# Patient Record
Sex: Male | Born: 1945 | Race: White | Hispanic: No | Marital: Married | Smoking: Never smoker
Health system: Southern US, Community
[De-identification: ages and names within clinical notes are randomized; demographics above are authoritative.]

## PROBLEM LIST (undated history)

## (undated) DIAGNOSIS — I10 Essential (primary) hypertension: Secondary | ICD-10-CM

## (undated) DIAGNOSIS — N529 Male erectile dysfunction, unspecified: Secondary | ICD-10-CM

## (undated) DIAGNOSIS — G4733 Obstructive sleep apnea (adult) (pediatric): Secondary | ICD-10-CM

## (undated) DIAGNOSIS — U071 COVID-19: Secondary | ICD-10-CM

## (undated) DIAGNOSIS — M489 Spondylopathy, unspecified: Secondary | ICD-10-CM

## (undated) DIAGNOSIS — E7800 Pure hypercholesterolemia, unspecified: Secondary | ICD-10-CM

## (undated) DIAGNOSIS — E291 Testicular hypofunction: Secondary | ICD-10-CM

## (undated) DIAGNOSIS — E669 Obesity, unspecified: Secondary | ICD-10-CM

## (undated) HISTORY — DX: Pure hypercholesterolemia, unspecified: E78.00

## (undated) HISTORY — DX: Obesity, unspecified: E66.9

## (undated) HISTORY — DX: Obstructive sleep apnea (adult) (pediatric): G47.33

## (undated) HISTORY — DX: Testicular hypofunction: E29.1

## (undated) HISTORY — PX: KNEE SURGERY: SHX244

## (undated) HISTORY — DX: Essential (primary) hypertension: I10

## (undated) HISTORY — DX: COVID-19: U07.1

## (undated) HISTORY — DX: Spondylopathy, unspecified: M48.9

## (undated) HISTORY — DX: Male erectile dysfunction, unspecified: N52.9

---

## 2009-07-22 ENCOUNTER — Encounter: Payer: Self-pay | Admitting: Cardiology

## 2009-09-02 DIAGNOSIS — E663 Overweight: Secondary | ICD-10-CM | POA: Insufficient documentation

## 2009-09-02 DIAGNOSIS — I10 Essential (primary) hypertension: Secondary | ICD-10-CM | POA: Insufficient documentation

## 2009-09-03 ENCOUNTER — Ambulatory Visit: Payer: Self-pay | Admitting: Cardiology

## 2009-09-03 DIAGNOSIS — E785 Hyperlipidemia, unspecified: Secondary | ICD-10-CM

## 2009-09-03 DIAGNOSIS — R9431 Abnormal electrocardiogram [ECG] [EKG]: Secondary | ICD-10-CM

## 2009-09-30 ENCOUNTER — Encounter: Payer: Self-pay | Admitting: Cardiology

## 2009-09-30 ENCOUNTER — Ambulatory Visit: Payer: Self-pay

## 2009-09-30 ENCOUNTER — Ambulatory Visit: Payer: Self-pay | Admitting: Cardiovascular Disease

## 2009-09-30 ENCOUNTER — Ambulatory Visit (HOSPITAL_COMMUNITY): Admission: RE | Admit: 2009-09-30 | Discharge: 2009-09-30 | Payer: Self-pay | Admitting: Cardiology

## 2010-05-01 HISTORY — PX: ROTATOR CUFF REPAIR: SHX139

## 2010-06-02 NOTE — Letter (Signed)
Summary: [facility]   Imported By: Kassie Mends 09/16/2009 08:57:19  _____________________________________________________________________  External Attachment:    Type:   Image     Comment:   External Document

## 2010-06-02 NOTE — Assessment & Plan Note (Signed)
Summary: np6/ self referral / over weight, htn, b/p up, cardiac workup...   Primary Provider:  [place]  CC:  Pt wasnt to get establish with a cardiologist.  History of Present Illness: 65 yo male with no prior cardiac history for evaluation of risk factors and abnormal electrocardiogram. He has no prior cardiac history. There is no dyspnea on exertion, orthopnea, PND, pedal edema, palpitations, syncope, chest pain or claudication.  Preventive Screening-Counseling & Management  Alcohol-Tobacco     Smoking Status: never  Current Medications (verified): 1)  Lipitor 10 Mg Tabs (Atorvastatin Calcium) .... Take One Tablet By Mouth Daily. 2)  Aspirin 81 Mg Tbec (Aspirin) .... Take One Tablet By Mouth Daily 3)  Benazepril Hcl 20 Mg Tabs (Benazepril Hcl) .Marland Kitchen.. 1 Tab By Mouth Once Daily 4)  Advil 200 Mg Tabs (Ibuprofen) .... As Needed  Past History:  Past Medical History: HYPERTENSION (ICD-401.9) Hyperlipidemia  Past Surgical History: Knee surgery  Family History: Reviewed history and no changes required. No premature CAD  Social History: Reviewed history and no changes required. Married  Tobacco Use - No.  Alcohol Use - yes Full Time Smoking Status:  never  Review of Systems       Some problems with knee arthralgias but no fevers or chills, productive cough, hemoptysis, dysphasia, odynophagia, melena, hematochezia, dysuria, hematuria, rash, seizure activity, orthopnea, PND, pedal edema, claudication. Remaining systems are negative.   Vital Signs:  Patient profile:   65 year old male Height:      73 inches Weight:      242 pounds BMI:     32.04 Pulse rate:   61 / minute Resp:     14 per minute BP sitting:   157 / 84  (left arm)  Vitals Entered By: Kem Parkinson (Sep 03, 2009 8:29 AM)  Physical Exam  General:  Well developed/well nourished in NAD Skin warm/dry Patient not depressed No peripheral clubbing Back-normal HEENT-normal/normal eyelids Neck  supple/normal carotid upstroke bilaterally; no bruits; no JVD; no thyromegaly chest - CTA/ normal expansion CV - RRR/normal S1 and S2; no murmurs, rubs or gallops;  PMI nondisplaced Abdomen -NT/ND, no HSM, no mass, + bowel sounds, no bruit 2+ femoral pulses, no bruits Ext-no edema, chords, 2+ DP Neuro-grossly nonfocal     EKG  Procedure date:  09/03/2009  Findings:      Sinus rhythm at a rate of 61. RV conduction delay. Cannot rule out prior inferior infarct.  Impression & Recommendations:  Problem # 1:  ABNORMAL ELECTROCARDIOGRAM (ICD-794.31)  No cardiac symptoms. Electrocardiogram shows sinus rhythm and cannot rule out prior inferior infarct. I will schedule a stress echocardiogram for risk stratification. The following medications were removed from the medication list:    Mavik 1 Mg Tabs (Trandolapril) .Marland Kitchen... 1 tab by mouth once daily His updated medication list for this problem includes:    Aspirin 81 Mg Tbec (Aspirin) .Marland Kitchen... Take one tablet by mouth daily    Benazepril Hcl 20 Mg Tabs (Benazepril hcl) .Marland Kitchen... 1 tab by mouth once daily  Orders: Stress Echo (Stress Echo)  Problem # 2:  HYPERTENSION (ICD-401.9) Patient's blood pressure is elevated today. I've asked him to follow this at home. If his systolic is greater than 140 or diastolic greater than 85 then we will increase his lotensin or add low-dose diuretic. The following medications were removed from the medication list:    Mavik 1 Mg Tabs (Trandolapril) .Marland Kitchen... 1 tab by mouth once daily His updated medication list for this  problem includes:    Aspirin 81 Mg Tbec (Aspirin) .Marland Kitchen... Take one tablet by mouth daily    Benazepril Hcl 20 Mg Tabs (Benazepril hcl) .Marland Kitchen... 1 tab by mouth once daily  The following medications were removed from the medication list:    Mavik 1 Mg Tabs (Trandolapril) .Marland Kitchen... 1 tab by mouth once daily His updated medication list for this problem includes:    Aspirin 81 Mg Tbec (Aspirin) .Marland Kitchen... Take one  tablet by mouth daily    Benazepril Hcl 20 Mg Tabs (Benazepril hcl) .Marland Kitchen... 1 tab by mouth once daily  Problem # 3:  HYPERLIPIDEMIA (ICD-272.4) Continue statin. Lipids and liver monitor by primary care. Previous HDL is low. We discussed the importance of exercise. His updated medication list for this problem includes:    Lipitor 10 Mg Tabs (Atorvastatin calcium) .Marland Kitchen... Take one tablet by mouth daily.  His updated medication list for this problem includes:    Lipitor 10 Mg Tabs (Atorvastatin calcium) .Marland Kitchen... Take one tablet by mouth daily.  Orders: Stress Echo (Stress Echo)  Problem # 4:  OVERWEIGHT (ICD-278.02) Would discuss the importance of exercise and diet today.  Patient Instructions: 1)  Your physician recommends that you schedule a follow-up appointment as needed 2)  Your physician has requested that you have a stress echocardiogram. For further information please visit [email or web address]  Please follow instruction sheet as given.

## 2012-10-18 ENCOUNTER — Institutional Professional Consult (permissible substitution): Payer: Self-pay | Admitting: Neurology

## 2013-01-16 ENCOUNTER — Encounter: Payer: Self-pay | Admitting: Neurology

## 2013-01-16 ENCOUNTER — Ambulatory Visit (INDEPENDENT_AMBULATORY_CARE_PROVIDER_SITE_OTHER): Payer: 59 | Admitting: Neurology

## 2013-01-16 VITALS — BP 135/81 | HR 63 | Temp 98.6°F | Ht 74.0 in | Wt 244.0 lb

## 2013-01-16 DIAGNOSIS — E785 Hyperlipidemia, unspecified: Secondary | ICD-10-CM | POA: Diagnosis not present

## 2013-01-16 DIAGNOSIS — G4761 Periodic limb movement disorder: Secondary | ICD-10-CM

## 2013-01-16 DIAGNOSIS — I10 Essential (primary) hypertension: Secondary | ICD-10-CM | POA: Diagnosis not present

## 2013-01-16 DIAGNOSIS — G4733 Obstructive sleep apnea (adult) (pediatric): Secondary | ICD-10-CM

## 2013-01-16 NOTE — Progress Notes (Signed)
Subjective:    Patient ID: Raymond Marshall is a 67 y.o. male.  HPI  Huston Foley, MD, PhD [facility] [address] [address] [place], [place] [address]  Dear Dr. Wylene Simmer,  I saw your patient, Raymond Marshall, upon your kind request in my neurologic clinic today for initial consultation of his sleep disorder, in particular concern for obstructive sleep apnea. The patient is accompanied by his wife today. As you know, Raymond Marshall is a very pleasant 67 year old right-handed gentleman with an underlying medical history of obesity, hyperlipidemia, cervical spine disease, who has been experiencing daytime somnolence and has been known to snore, which can be loud. He has made snorting and gasping sounds at night. He kicks quite vehemently at times in his sleep.  His bedtime is between 8:30 - 9 PM and has occasional difficulty falling asleep. He has a wake time of 5:30-6 AM and does not feel rested in the morning. He denies morning HAs. He has been sleepy during the day for years and his ESS is 15/24 today. He has a FHx of OSA in his father (suspected, not tested), PU (on CPAP) and his brother (who had surgery for OSA).  He has tried temazepam and takes it infrequently, perhaps once every 1-2 months. He had a HST on 01/06/13, reported on 01/10/13, with an est. AHI of 23/h and min. O2 saturation of 87.7%.  He denies morning headaches. He has not fallen asleep while driving. The patient has not been taking a scheduled nap, but sometimes when he comes home for lunch he falls asleep after lunch.   He has been known to snore for the past many years. Snoring is reportedly moderate, and associated with choking sounds. The patient denies a sense of choking or strangling feeling. There is no report of nighttime reflux, with no nighttime cough experienced. The patient has not noted any RLS symptoms, but kicks in his sleep, almost every 20 s, per wife. He denies cataplexy, sleep paralysis,  hypnagogic or hypnopompic hallucinations, or sleep attacks. He does not report any vivid dreams, nightmares, dream enactments, or parasomnias, such as sleep talking or sleep walking.   His bedroom is usually dark and cool. There is a TV in the bedroom and usually it is not on at night.   His Past Medical History Is Significant For: Past Medical History  Diagnosis Date  . Hypertension   . High cholesterol     His Past Surgical History Is Significant For: Past Surgical History  Procedure Laterality Date  . Rotator cuff repair  2012  . Knee surgery Left     His Family History Is Significant For: Family History  Problem Relation Age of Onset  . Cancer Maternal Grandmother     His Social History Is Significant For: History   Social History  . Marital Status: Married    Spouse Name: Raymond Marshall    Number of Children: 2  . Years of Education: Assoc. Deg   Occupational History  .      [employer]   Social History Main Topics  . Smoking status: Current Some Day Smoker    Types: Cigars  . Smokeless tobacco: Never Used     Comment: 2 or 3 times monthly  . Alcohol Use: Yes     Comment: 4-5 alcohol drinks per week  . Drug Use: No  . Sexual Activity: None   Other Topics Concern  . None   Social History Narrative   Patient lives  at home with spouse.   Caffeine Use: occasionally    His Allergies Are:  No Known Allergies:   His Current Medications Are:  Outpatient Encounter Prescriptions as of 01/16/2013  Medication Sig Dispense Refill  . atorvastatin (LIPITOR) 10 MG tablet Take 10 mg by mouth daily.      . benazepril (LOTENSIN) 20 MG tablet Take 20 mg by mouth daily.       No facility-administered encounter medications on file as of 01/16/2013.  :  Review of Systems:  Out of a complete 14 point review of systems, all are reviewed and negative with the exception of these symptoms as listed below:  Review of Systems  Constitutional: Positive for fatigue.  HENT:        Ringing in ears  Eyes:       Blurred vision  Musculoskeletal: Positive for arthralgias.  Neurological: Positive for numbness.  Psychiatric/Behavioral: Positive for sleep disturbance (insomnia, snoring, restless legs).       Decreased energy    Objective:  Neurologic Exam  Physical Exam Physical Examination:   Filed Vitals:   01/16/13 0902  BP: 135/81  Pulse: 63  Temp: 98.6 F (37 C)    General Examination: The patient is a very pleasant 67 y.o. male in no acute distress. He appears well-developed and well-nourished and very well groomed. He is overweight.   HEENT: Normocephalic, atraumatic, pupils are equal, round and reactive to light and accommodation. Funduscopic exam is normal with sharp disc margins noted. Extraocular tracking is good without limitation to gaze excursion or nystagmus noted. Normal smooth pursuit is noted. Hearing is grossly intact. Tympanic membranes are clear bilaterally. Face is symmetric with normal facial animation and normal facial sensation. Speech is clear with no dysarthria noted. There is no hypophonia. There is no lip, neck/head, jaw or voice tremor. Neck is supple with full range of passive and active motion. There are no carotid bruits on auscultation. Oropharynx exam reveals: mild mouth dryness, adequate dental hygiene and moderate airway crowding, due to redundant soft palate, elongated uvula, and larger tongue. I could not visualize his tonsils. Mallampati is class III. Tongue protrudes centrally and palate elevates symmetrically. Neck size is 17.75 inches.   Chest: Clear to auscultation without wheezing, rhonchi or crackles noted.  Heart: S1+S2+0, regular and normal without murmurs, rubs or gallops noted.   Abdomen: Soft, non-tender and non-distended with normal bowel sounds appreciated on auscultation.  Extremities: There is trace pitting edema in the distal lower extremities bilaterally. Pedal pulses are intact.  Skin: Warm and dry without  trophic changes noted. There are no varicose veins.  Musculoskeletal: exam reveals no obvious joint deformities, tenderness or joint swelling or erythema.   Neurologically:  Mental status: The patient is awake, alert and oriented in all 4 spheres. His memory, attention, language and knowledge are appropriate. There is no aphasia, agnosia, apraxia or anomia. Speech is clear with normal prosody and enunciation. Thought process is linear. Mood is congruent and affect is normal.  Cranial nerves are as described above under HEENT exam. In addition, shoulder shrug is normal with equal shoulder height noted. Motor exam: Normal bulk, strength and tone is noted. There is no drift, tremor or rebound. Romberg is negative. Reflexes are 2+ throughout. Toes are downgoing bilaterally. Fine motor skills are intact with normal finger taps, normal hand movements, normal rapid alternating patting, normal foot taps and normal foot agility.  Cerebellar testing shows no dysmetria or intention tremor on finger to nose testing. Heel to  shin is unremarkable bilaterally. There is no truncal or gait ataxia.  Sensory exam is intact to light touch, pinprick, vibration, temperature sense and proprioception in the upper and lower extremities.  Gait, station and balance are unremarkable. No veering to one side is noted. No leaning to one side is noted. Posture is age-appropriate and stance is narrow based. No problems turning are noted. He turns en bloc. Tandem walk is unremarkable. Intact toe and heel stance is noted.              Assessment and Plan:   In summary, DAMACIO WEISGERBER is a very pleasant 67 y.o.-year old male with a history and physical exam concerning for obstructive sleep apnea (OSA). A recent HST demonstrated an estimated AHI of 23 per hour with an oxyhemoglobin desaturation nadir of 87.7%. 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 He also has a history significant for periodic leg movements of sleep, in the  absence of overt RLS symptoms. I had a long chat with the patient and his wife about my findings and the diagnosis of OSA, its prognosis and treatment options. We talked about medical treatments and non-pharmacological approaches. I explained in particular the risks and ramifications of untreated moderate to severe OSA, especially with respect to developing cardiovascular disease down the Road, including congestive heart failure, difficult to treat hypertension, cardiac arrhythmias, or stroke. Even type 2 diabetes has in part been linked to untreated OSA. We talked about trying to maintain a healthy lifestyle in general, as well as the importance of weight control. I encouraged the patient to eat healthy, exercise daily and keep well hydrated, to keep a scheduled bedtime and wake time routine, to not skip any meals and eat healthy snacks in between meals.  I recommended the following at this time: sleep study with potential positive airway pressure titration.  I explained the sleep test procedure to the patient and also outlined possible surgical and non-surgical treatment options of OSA, including the use of a custom-made dental device, upper airway surgical options, such as pillar implants, radiofrequency surgery, tongue base surgery, and UPPP. I also explained the CPAP treatment option to the patient, who indicated that he would be willing to try CPAP if the need arises. I explained the importance of being compliant with PAP treatment, not only for insurance purposes but primarily to improve His symptoms, and for the patient's long term health benefit, including to reduce His cardiovascular risks. I answered all their questions today and the patient and his wife were in agreement. I would like to see him back after the sleep study is completed and encouraged them to call with any interim questions, concerns, problems or updates.   Thank you very much for allowing me to participate in the care of this nice  patient. If I can be of any further assistance to you please do not hesitate to call me at [phone].  Sincerely,   Huston Foley, MD, PhD

## 2013-01-16 NOTE — Patient Instructions (Signed)
Based on your symptoms and your exam I believe you are at risk for obstructive sleep apnea or OSA, and I think we should proceed with a sleep study to determine whether you do or do not have OSA and how severe it is. If you have more than mild OSA, I want you to consider treatment with CPAP. Please remember, the risks and ramifications of moderate to severe obstructive sleep apnea or OSA are: Cardiovascular disease, including congestive heart failure, stroke, difficult to control hypertension, arrhythmias, and even type 2 diabetes has been linked to untreated OSA. Sleep apnea causes disruption of sleep and sleep deprivation in most cases, which, in turn, can cause recurrent headaches, problems with memory, mood, concentration, focus, and vigilance. Most people with untreated sleep apnea report excessive daytime sleepiness, which can affect their ability to drive. Please do not drive if you feel sleepy.  I will see you back after your sleep study to go over the test results and where to go from there. We will call you after your sleep study and to set up an appointment at the time.   Based on your recent home sleep test, you may have moderate OSA.

## 2013-01-30 DIAGNOSIS — J069 Acute upper respiratory infection, unspecified: Secondary | ICD-10-CM | POA: Diagnosis not present

## 2013-01-30 DIAGNOSIS — Z6837 Body mass index (BMI) 37.0-37.9, adult: Secondary | ICD-10-CM | POA: Diagnosis not present

## 2013-02-12 ENCOUNTER — Ambulatory Visit (INDEPENDENT_AMBULATORY_CARE_PROVIDER_SITE_OTHER): Payer: Medicare Other | Admitting: Neurology

## 2013-02-12 DIAGNOSIS — G479 Sleep disorder, unspecified: Secondary | ICD-10-CM

## 2013-02-12 DIAGNOSIS — G4733 Obstructive sleep apnea (adult) (pediatric): Secondary | ICD-10-CM | POA: Diagnosis not present

## 2013-02-12 DIAGNOSIS — I10 Essential (primary) hypertension: Secondary | ICD-10-CM

## 2013-02-12 DIAGNOSIS — G4710 Hypersomnia, unspecified: Secondary | ICD-10-CM

## 2013-02-12 DIAGNOSIS — G4761 Periodic limb movement disorder: Secondary | ICD-10-CM | POA: Diagnosis not present

## 2013-02-12 DIAGNOSIS — R9431 Abnormal electrocardiogram [ECG] [EKG]: Secondary | ICD-10-CM

## 2013-02-21 ENCOUNTER — Telehealth: Payer: Self-pay | Admitting: Neurology

## 2013-02-21 DIAGNOSIS — G4733 Obstructive sleep apnea (adult) (pediatric): Secondary | ICD-10-CM

## 2013-02-21 NOTE — Telephone Encounter (Signed)
Please call and notify the patient that the recent sleep study did confirm the diagnosis of obstructive sleep apnea and that I recommend treatment for this in the form of CPAP. This will require a repeat sleep study for proper titration and mask fitting. Please advise him, that we did not end up using CPAP during this last test, because of poorly consolidated sleep, and his sleep apnea became worse later, as he went into dream sleep. Due to persistent desaturations into the 80s, I believe, he will do better with CPAP. Please explain to patient and arrange for a CPAP titration study. I have placed an order in the chart. Thanks, Huston Foley, MD, PhD [facility])

## 2013-02-24 ENCOUNTER — Encounter: Payer: Self-pay | Admitting: *Deleted

## 2013-02-24 NOTE — Telephone Encounter (Signed)
I called and left a message for the patient to callback to speak with me Raymond Marshall) concerning his sleep study results.

## 2013-02-25 NOTE — Telephone Encounter (Signed)
Patient's spouse called for his sleep study results. Informed the spouse. I informed the patient's spouse that his recent sleep study revealed the diagnosis of obstructive sleep apnea and that Dr. Frances Furbish is recommending CPAP therapy, so the patient will need to be set-up for a another sleep study (CPAP Titration). Patient's spouse understood and has set a date and time for this study.

## 2013-03-03 DIAGNOSIS — N529 Male erectile dysfunction, unspecified: Secondary | ICD-10-CM | POA: Diagnosis not present

## 2013-03-03 DIAGNOSIS — G4733 Obstructive sleep apnea (adult) (pediatric): Secondary | ICD-10-CM | POA: Diagnosis not present

## 2013-03-03 DIAGNOSIS — E785 Hyperlipidemia, unspecified: Secondary | ICD-10-CM | POA: Diagnosis not present

## 2013-03-03 DIAGNOSIS — I10 Essential (primary) hypertension: Secondary | ICD-10-CM | POA: Diagnosis not present

## 2013-03-03 DIAGNOSIS — Z79899 Other long term (current) drug therapy: Secondary | ICD-10-CM | POA: Diagnosis not present

## 2013-03-03 DIAGNOSIS — Z1331 Encounter for screening for depression: Secondary | ICD-10-CM | POA: Diagnosis not present

## 2013-03-03 DIAGNOSIS — G4700 Insomnia, unspecified: Secondary | ICD-10-CM | POA: Diagnosis not present

## 2013-03-03 DIAGNOSIS — Z23 Encounter for immunization: Secondary | ICD-10-CM | POA: Diagnosis not present

## 2013-03-19 ENCOUNTER — Ambulatory Visit (INDEPENDENT_AMBULATORY_CARE_PROVIDER_SITE_OTHER): Payer: Medicare Other

## 2013-03-19 DIAGNOSIS — G479 Sleep disorder, unspecified: Secondary | ICD-10-CM | POA: Diagnosis not present

## 2013-03-19 DIAGNOSIS — G4710 Hypersomnia, unspecified: Secondary | ICD-10-CM | POA: Diagnosis not present

## 2013-03-19 DIAGNOSIS — G4761 Periodic limb movement disorder: Secondary | ICD-10-CM

## 2013-03-19 DIAGNOSIS — G4733 Obstructive sleep apnea (adult) (pediatric): Secondary | ICD-10-CM

## 2013-04-02 ENCOUNTER — Telehealth: Payer: Self-pay | Admitting: Neurology

## 2013-04-02 DIAGNOSIS — G4733 Obstructive sleep apnea (adult) (pediatric): Secondary | ICD-10-CM

## 2013-04-02 DIAGNOSIS — G4761 Periodic limb movement disorder: Secondary | ICD-10-CM

## 2013-04-02 NOTE — Telephone Encounter (Signed)
Please call and inform patient that I have entered an order for treatment with PAP. He did well during the latest sleep study with CPAP. We will, therefore, arrange for a machine for home use through a DME (durable medical equipment) company of His choice; and I will see the patient back in follow-up in about 6 weeks. Please also explain to the patient that I will be looking out for compliance data downloaded from the machine, which can be done remotely through a modem at times or stored on an SD card in the back of the machine. At the time of the followup appointment we will discuss sleep study results and how it is going with PAP treatment at home. Please advise patient to bring His machine at the time of the visit; at least for the first visit, even though this is cumbersome. Bringing the machine for every visit after that may not be needed, but often helps for the first visit. Please also make sure, the patient has a follow-up appointment with me in about 6 weeks from the setup date, thanks.   Dameshia Seybold, MD, PhD [facility]  

## 2013-04-03 ENCOUNTER — Encounter: Payer: Self-pay | Admitting: *Deleted

## 2013-04-03 NOTE — Telephone Encounter (Signed)
I called and spoke with patient's spouse about his sleep study results. I informed the patient's spouse that the patient did well on CPAP and that Dr. Frances Furbish recommends he start CPAP therapy at home.  I informed the patient's spouse that I will send the CPAP order to [facility] and will contact them. I will also mail a copy of the report along with a letter with follow instructions. I will fax a copy to Dr. Deneen Harts office.

## 2013-05-15 ENCOUNTER — Encounter: Payer: Self-pay | Admitting: Neurology

## 2013-05-20 NOTE — Progress Notes (Signed)
Quick Note:  I reviewed the patient's CPAP compliance data from 04/11/2013 to 05/10/2013, which is a total of 30 days, during which time the patient used CPAP every day. The average usage for all days was 7 hours and 45 minutes. The percent used days greater than 4 hours was 97 %, indicating excellent compliance. The residual AHI was 2.9 per hour, indicating an appropriate treatment pressure of 10 cwp with EPR of 2. However, his leak was at times quite high, with the 95th percentile at 34.3 L per minute. I will review this data with the patient at the next office visit, which has been scheduled for 05/22/2013 at 11 AM, provide feedback and additional troubleshooting if need be.  Star Age, MD, PhD [facility]   ______

## 2013-05-22 ENCOUNTER — Ambulatory Visit: Payer: Medicare Other | Admitting: Neurology

## 2013-05-28 ENCOUNTER — Ambulatory Visit (INDEPENDENT_AMBULATORY_CARE_PROVIDER_SITE_OTHER): Payer: Medicare Other | Admitting: Neurology

## 2013-05-28 ENCOUNTER — Encounter (INDEPENDENT_AMBULATORY_CARE_PROVIDER_SITE_OTHER): Payer: Self-pay

## 2013-05-28 ENCOUNTER — Encounter: Payer: Self-pay | Admitting: Neurology

## 2013-05-28 VITALS — BP 134/83 | HR 62 | Temp 97.8°F | Ht 74.0 in | Wt 252.0 lb

## 2013-05-28 DIAGNOSIS — G4761 Periodic limb movement disorder: Secondary | ICD-10-CM

## 2013-05-28 DIAGNOSIS — E785 Hyperlipidemia, unspecified: Secondary | ICD-10-CM | POA: Diagnosis not present

## 2013-05-28 DIAGNOSIS — E669 Obesity, unspecified: Secondary | ICD-10-CM

## 2013-05-28 DIAGNOSIS — I10 Essential (primary) hypertension: Secondary | ICD-10-CM | POA: Diagnosis not present

## 2013-05-28 DIAGNOSIS — G4733 Obstructive sleep apnea (adult) (pediatric): Secondary | ICD-10-CM

## 2013-05-28 NOTE — Patient Instructions (Signed)

## 2013-05-28 NOTE — Progress Notes (Signed)
Subjective:    Patient ID: Raymond Marshall is a 68 y.o. male.  HPI    Interim history:   Raymond Marshall is a very pleasant 68 year old right-handed gentleman with an underlying medical history of obesity, hyperlipidemia, and cervical spine disease, who presents for followup consultation of his obstructive sleep apnea. He is unaccompanied today. I first met him on 01/16/2013, at which time he reported loud snoring and excessive daytime somnolence as well as leg kicking while asleep. I suggested a sleep study and he came back for baseline sleep study on 02/12/2013, followed by a CPAP titration study on 03/19/2013. I went over his test results with him in detail today. His baseline sleep study on 02/12/2013 showed a sleep efficiency at 68.4% with a normal latency to sleep of 11.5 minutes and a high wake after sleep onset of 143 minutes with severe sleep fragmentation noted. He had an increased arousal index. This was because of respiratory events and periodic leg movements. He had increased percentages were stage I and stage II sleep, near absence of slow-wave sleep and a decreased percentage of REM sleep with a markedly prolonged REM latency of 322.5 minutes. He had severe periodic leg movements at 78.9 per hour and an associated arousal index of 9.9 per hour. He had occasional PVCs on EKG. He had mild to moderate snoring. His total AHI was 7.7 per hour, rising to 43 per hour and REM sleep and nearly 17 per hour in the supine position. Baseline oxygen saturation was only 91%, with a nadir of 84%. He spent 1 hour and 18 minutes below the saturation of 90% for the night. He was requested to return for CPAP titration study which he had on 03/19/2013. His sleep efficiency was near normal at 89.3% with a normal latency to sleep and wake after sleep onset of 43.5 minutes with mild sleep fragmentation noted. He had increased percentages of light stage sleep, he had near absence of slow-wave sleep, and a decreased  percentage of REM sleep with a mildly prolonged REM latency. He again was noted to have severe periodic leg movements at 72.8 per hour resulting in an arousal index of 7.3 per hour. He again was noted to have occasional PVCs. His average oxygen saturation was 92%, his nadir was 85% and he spent 9 minutes and 15 seconds below the saturation of 90%. He was titrated from 5-10 cm of CPAP and had a residual AHI of 0 per hour at the final pressure with supine REM sleep achieved. I reviewed compliance data from 04/11/2013 through 05/10/2013 which is a total of 30 days during which time he used CPAP every night, percent used days greater than 4 hours was 97%, indicating excellent compliance. His average usage was 7 hours and 45 minutes. Residual AHI was 2.9 per hour on the set pressure of 10 cm with EPR of 2. However, his leak was at times quite high with the 95th percentile of 34.3 L per minute.  Today, I reviewed the compliance data off his machine: 04/11/2013 through 05/27/2013 which is a total of 47 days during which time his CPAP every night. Percent used days greater than 4 hours was 96%, indicating excellent compliance. AHI was 2.8. His leak again was at times quite high. The 95th percentile was 35 L per minute. His pressures at 10 with EPR of 2. The average usage for all days was 7 hours and 43 minutes.  Today, he reports resting much better and sleeping better with CPAP, having  adjusted well to treatment. He does not have to take a nap in the afternoon. He does not have RLS symptoms, but his wife reports, that he does not kick in his sleep as much. He has no new complaints and feels, he is doing well. He is using a nasal pillows mask. He has had no recent illness or changes in his medications. He continues to work full time. He denies any palpitations or chest pain or any cardiac symptoms. He does not have any edema.  He had a HST on 01/06/13, reported on 01/10/13, with an est. AHI of 23/h and min. O2 saturation  of 87.7%.    His Past Medical History Is Significant For: Past Medical History  Diagnosis Date  . Hypertension   . High cholesterol     His Past Surgical History Is Significant For: Past Surgical History  Procedure Laterality Date  . Rotator cuff repair  2012  . Knee surgery Left     His Family History Is Significant For: Family History  Problem Relation Age of Onset  . Cancer Maternal Grandmother     His Social History Is Significant For: History   Social History  . Marital Status: Married    Spouse Name: Bonnita Nasuti    Number of Children: 2  . Years of Education: Assoc. Deg   Occupational History  .      [employer]   Social History Main Topics  . Smoking status: Current Some Day Smoker    Types: Cigars  . Smokeless tobacco: Never Used     Comment: 2 or 3 times monthly  . Alcohol Use: Yes     Comment: 4-5 alcohol drinks per week  . Drug Use: No  . Sexual Activity: None   Other Topics Concern  . None   Social History Narrative   Patient lives at home with spouse.   Caffeine Use: occasionally    His Allergies Are:  No Known Allergies:   His Current Medications Are:  Outpatient Encounter Prescriptions as of 05/28/2013  Medication Sig  . atorvastatin (LIPITOR) 10 MG tablet Take 10 mg by mouth daily.  . benazepril (LOTENSIN) 20 MG tablet Take 20 mg by mouth daily.  :  Review of Systems:  Out of a complete 14 point review of systems, all are reviewed and negative with the exception of these symptoms as listed below:  Review of Systems  Constitutional: Negative.   HENT: Positive for tinnitus.   Eyes: Negative.   Respiratory: Negative.   Cardiovascular: Negative.   Gastrointestinal: Negative.   Endocrine: Negative.   Genitourinary: Negative.   Skin: Negative.   Allergic/Immunologic: Negative.   Neurological: Negative.   Hematological: Negative.   Psychiatric/Behavioral: Positive for sleep disturbance (restless leg).    Objective:  Neurologic  Exam  Physical Exam Physical Examination:   Filed Vitals:   05/28/13 1114  BP: 134/83  Pulse: 62  Temp: 97.8 F (36.6 C)    General Examination: The patient is a very pleasant 68 y.o. male in no acute distress. He appears well-developed and well-nourished and very well groomed. He is overweight.   HEENT: Normocephalic, atraumatic, pupils are equal, round and reactive to light and accommodation. Funduscopic exam is normal with sharp disc margins noted. Extraocular tracking is good without limitation to gaze excursion or nystagmus noted. Normal smooth pursuit is noted. Hearing is grossly intact. Face is symmetric with normal facial animation and normal facial sensation. Speech is clear with no dysarthria noted. There is no hypophonia. There  is no lip, neck/head, jaw or voice tremor. Neck is supple with full range of passive and active motion. There are no carotid bruits on auscultation. Oropharynx exam reveals: mild mouth dryness, adequate dental hygiene and moderate airway crowding, due to redundant soft palate, elongated uvula, and larger tongue. I could not visualize his tonsils. Mallampati is class III. Tongue protrudes centrally and palate elevates symmetrically.   Chest: Clear to auscultation without wheezing, rhonchi or crackles noted.  Heart: S1+S2+0, regular and normal without murmurs, rubs or gallops noted.   Abdomen: Soft, non-tender and non-distended with normal bowel sounds appreciated on auscultation.  Extremities: There is trace pitting edema in the distal lower extremities bilaterally. Pedal pulses are intact.  Skin: Warm and dry without trophic changes noted. There are no varicose veins.  Musculoskeletal: exam reveals no obvious joint deformities, tenderness or joint swelling or erythema.   Neurologically:  Mental status: The patient is awake, alert and oriented in all 4 spheres. His memory, attention, language and knowledge are appropriate. There is no aphasia, agnosia,  apraxia or anomia. Speech is clear with normal prosody and enunciation. Thought process is linear. Mood is congruent and affect is normal.  Cranial nerves are as described above under HEENT exam. In addition, shoulder shrug is normal with equal shoulder height noted. Motor exam: Normal bulk, strength and tone is noted. There is no drift, tremor or rebound. Romberg is negative. Reflexes are 2+ throughout. Fine motor skills are intact with normal finger taps, normal hand movements, normal rapid alternating patting, normal foot taps and normal foot agility.  Cerebellar testing shows no dysmetria or intention tremor on finger to nose testing. Heel to shin is unremarkable bilaterally. There is no truncal or gait ataxia.  Sensory exam is intact to light touch, pinprick, vibration, temperature sense in the upper and lower extremities.  Gait, station and balance are unremarkable. No veering to one side is noted. No leaning to one side is noted. Posture is age-appropriate and stance is narrow based. No problems turning are noted. He turns en bloc. Tandem walk is unremarkable. Intact toe and heel stance is noted.       Assessment and Plan:   In summary, Raymond Marshall is a very pleasant 68 y.o.-year old male with an underlying medical history of obesity, hypertension, hyperlipidemia, and cervical spine disease who was recently diagnosed with overall mild obstructive sleep apnea, severe in rem sleep a number of events. He has established treatment with CPAP therapy at a pressure of 10 cm with a PR of 2. I talked to him at length about his to sleep study results as well as his compliance data. He is congratulated on his great compliance. He indicates good tolerance of the mask and the pressure and improvement of his symptoms. He feels better rested and is pleased with how he is doing. I encouraged him to continue to use CPAP regularly to help reduce cardiovascular risk. His exam is stable. While his sleep studies  showed periodic leg movements of sleep, he does not endorse much in the way of restless leg symptoms and per wife is noted to kick less in his sleep now that he is on CPAP. If he develops restless leg symptoms down the road we will investigate this further. Also, his EKG showed occasional PVCs but he has no cardiovascular symptoms at this time. He has in particular no evidence of palpitations or chest pain or shortness of breath. I explained to him that it is not unusual to  see occasional PVCs on EKG at this age. We also talked about trying to maintaining a healthy lifestyle in general. I encouraged the patient to eat healthy, exercise daily and keep well hydrated, to keep a scheduled bedtime and wake time routine, to not skip any meals and eat healthy snacks in between meals and to have protein with every meal. I stressed the importance of regular exercise.   I answered all  his questions today and the patient was in agreement with the above outlined plan. I would like to see the patient back in 6 months, sooner if the need arises and encouraged him to call with any interim questions, concerns, problems or updates.

## 2013-05-30 ENCOUNTER — Encounter: Payer: Self-pay | Admitting: Neurology

## 2013-06-13 ENCOUNTER — Encounter: Payer: Self-pay | Admitting: Neurology

## 2013-06-13 NOTE — Progress Notes (Signed)
Quick Note:  I reviewed the patient's CPAP compliance data from 05/12/2013 to 06/10/2013, which is a total of 30 days, during which time the patient used CPAP every day. The average usage for all days was 7 hours and 27 minutes. The percent used days greater than 4 hours was 93 %, indicating excellent compliance. The residual AHI was 2.6 per hour, indicating an appropriate treatment pressure of 10 cwp with EPR of 2. The air leak was at times higher. I will review this data with the patient at the next office visit, which is currently scheduled for 11/26/2013 at 11:30 AM, provide feedback and additional troubleshooting if need be.  Star Age, MD, PhD [facility] ([facility])   ______

## 2013-06-18 ENCOUNTER — Encounter: Payer: Self-pay | Admitting: Neurology

## 2013-09-08 DIAGNOSIS — I10 Essential (primary) hypertension: Secondary | ICD-10-CM | POA: Diagnosis not present

## 2013-09-08 DIAGNOSIS — E785 Hyperlipidemia, unspecified: Secondary | ICD-10-CM | POA: Diagnosis not present

## 2013-09-08 DIAGNOSIS — Z125 Encounter for screening for malignant neoplasm of prostate: Secondary | ICD-10-CM | POA: Diagnosis not present

## 2013-09-15 DIAGNOSIS — M542 Cervicalgia: Secondary | ICD-10-CM | POA: Diagnosis not present

## 2013-09-15 DIAGNOSIS — E291 Testicular hypofunction: Secondary | ICD-10-CM | POA: Diagnosis not present

## 2013-09-15 DIAGNOSIS — G4700 Insomnia, unspecified: Secondary | ICD-10-CM | POA: Diagnosis not present

## 2013-09-15 DIAGNOSIS — Z125 Encounter for screening for malignant neoplasm of prostate: Secondary | ICD-10-CM | POA: Diagnosis not present

## 2013-09-15 DIAGNOSIS — N529 Male erectile dysfunction, unspecified: Secondary | ICD-10-CM | POA: Diagnosis not present

## 2013-09-15 DIAGNOSIS — Z79899 Other long term (current) drug therapy: Secondary | ICD-10-CM | POA: Diagnosis not present

## 2013-09-15 DIAGNOSIS — E785 Hyperlipidemia, unspecified: Secondary | ICD-10-CM | POA: Diagnosis not present

## 2013-09-15 DIAGNOSIS — I10 Essential (primary) hypertension: Secondary | ICD-10-CM | POA: Diagnosis not present

## 2013-09-15 DIAGNOSIS — Z0000 Encounter for general adult medical examination without abnormal findings: Secondary | ICD-10-CM | POA: Diagnosis not present

## 2013-11-05 ENCOUNTER — Encounter: Payer: Self-pay | Admitting: Neurology

## 2013-11-26 ENCOUNTER — Ambulatory Visit: Payer: Medicare Other | Admitting: Neurology

## 2014-03-11 ENCOUNTER — Telehealth: Payer: Self-pay | Admitting: *Deleted

## 2014-03-11 NOTE — Telephone Encounter (Signed)
Call pt to resched apt. Lft VM with new apt date and time. Requested he call in if the apt does not fit her schedule.

## 2014-03-23 DIAGNOSIS — Z23 Encounter for immunization: Secondary | ICD-10-CM | POA: Diagnosis not present

## 2014-04-21 ENCOUNTER — Ambulatory Visit: Payer: Medicare Other | Admitting: Neurology

## 2014-04-28 ENCOUNTER — Ambulatory Visit: Payer: Medicare Other | Admitting: Neurology

## 2014-07-21 DIAGNOSIS — D485 Neoplasm of uncertain behavior of skin: Secondary | ICD-10-CM | POA: Diagnosis not present

## 2014-07-21 DIAGNOSIS — L570 Actinic keratosis: Secondary | ICD-10-CM | POA: Diagnosis not present

## 2014-07-21 DIAGNOSIS — C4491 Basal cell carcinoma of skin, unspecified: Secondary | ICD-10-CM | POA: Diagnosis not present

## 2014-07-21 DIAGNOSIS — L821 Other seborrheic keratosis: Secondary | ICD-10-CM | POA: Diagnosis not present

## 2014-07-21 DIAGNOSIS — Z85828 Personal history of other malignant neoplasm of skin: Secondary | ICD-10-CM | POA: Diagnosis not present

## 2014-07-22 DIAGNOSIS — D2339 Other benign neoplasm of skin of other parts of face: Secondary | ICD-10-CM | POA: Diagnosis not present

## 2014-09-07 DIAGNOSIS — H1789 Other corneal scars and opacities: Secondary | ICD-10-CM | POA: Diagnosis not present

## 2014-09-07 DIAGNOSIS — D2311 Other benign neoplasm of skin of right eyelid, including canthus: Secondary | ICD-10-CM | POA: Diagnosis not present

## 2014-11-23 DIAGNOSIS — I10 Essential (primary) hypertension: Secondary | ICD-10-CM | POA: Diagnosis not present

## 2014-11-23 DIAGNOSIS — E785 Hyperlipidemia, unspecified: Secondary | ICD-10-CM | POA: Diagnosis not present

## 2014-11-23 DIAGNOSIS — Z125 Encounter for screening for malignant neoplasm of prostate: Secondary | ICD-10-CM | POA: Diagnosis not present

## 2014-12-31 DIAGNOSIS — I10 Essential (primary) hypertension: Secondary | ICD-10-CM | POA: Diagnosis not present

## 2014-12-31 DIAGNOSIS — E785 Hyperlipidemia, unspecified: Secondary | ICD-10-CM | POA: Diagnosis not present

## 2014-12-31 DIAGNOSIS — E669 Obesity, unspecified: Secondary | ICD-10-CM | POA: Diagnosis not present

## 2014-12-31 DIAGNOSIS — G4700 Insomnia, unspecified: Secondary | ICD-10-CM | POA: Diagnosis not present

## 2014-12-31 DIAGNOSIS — R001 Bradycardia, unspecified: Secondary | ICD-10-CM | POA: Diagnosis not present

## 2014-12-31 DIAGNOSIS — Z6831 Body mass index (BMI) 31.0-31.9, adult: Secondary | ICD-10-CM | POA: Diagnosis not present

## 2014-12-31 DIAGNOSIS — Z23 Encounter for immunization: Secondary | ICD-10-CM | POA: Diagnosis not present

## 2014-12-31 DIAGNOSIS — Z0000 Encounter for general adult medical examination without abnormal findings: Secondary | ICD-10-CM | POA: Diagnosis not present

## 2014-12-31 DIAGNOSIS — N529 Male erectile dysfunction, unspecified: Secondary | ICD-10-CM | POA: Diagnosis not present

## 2014-12-31 DIAGNOSIS — Z5181 Encounter for therapeutic drug level monitoring: Secondary | ICD-10-CM | POA: Diagnosis not present

## 2014-12-31 DIAGNOSIS — G4733 Obstructive sleep apnea (adult) (pediatric): Secondary | ICD-10-CM | POA: Diagnosis not present

## 2015-01-05 ENCOUNTER — Other Ambulatory Visit: Payer: Self-pay | Admitting: Internal Medicine

## 2015-01-05 ENCOUNTER — Inpatient Hospital Stay: Admission: RE | Admit: 2015-01-05 | Payer: Self-pay | Source: Ambulatory Visit

## 2015-01-05 DIAGNOSIS — R9389 Abnormal findings on diagnostic imaging of other specified body structures: Secondary | ICD-10-CM

## 2015-01-06 ENCOUNTER — Ambulatory Visit
Admission: RE | Admit: 2015-01-06 | Discharge: 2015-01-06 | Disposition: A | Discharge status: Home / Self Care | Payer: Medicare Other | Source: Ambulatory Visit | Attending: Internal Medicine | Admitting: Internal Medicine

## 2015-01-06 DIAGNOSIS — R9389 Abnormal findings on diagnostic imaging of other specified body structures: Secondary | ICD-10-CM

## 2015-01-06 DIAGNOSIS — R918 Other nonspecific abnormal finding of lung field: Secondary | ICD-10-CM | POA: Diagnosis not present

## 2015-01-06 MED ORDER — IOPAMIDOL (ISOVUE-300) INJECTION 61%
75.0000 mL | Freq: Once | INTRAVENOUS | Status: AC | PRN
  Administered 2015-01-06: 75 mL via INTRAVENOUS

## 2015-04-12 DIAGNOSIS — M7712 Lateral epicondylitis, left elbow: Secondary | ICD-10-CM | POA: Diagnosis not present

## 2015-04-12 DIAGNOSIS — S161XXA Strain of muscle, fascia and tendon at neck level, initial encounter: Secondary | ICD-10-CM | POA: Diagnosis not present

## 2016-01-06 DIAGNOSIS — Z125 Encounter for screening for malignant neoplasm of prostate: Secondary | ICD-10-CM | POA: Diagnosis not present

## 2016-01-06 DIAGNOSIS — I10 Essential (primary) hypertension: Secondary | ICD-10-CM | POA: Diagnosis not present

## 2016-01-06 DIAGNOSIS — E784 Other hyperlipidemia: Secondary | ICD-10-CM | POA: Diagnosis not present

## 2016-01-13 DIAGNOSIS — Z0000 Encounter for general adult medical examination without abnormal findings: Secondary | ICD-10-CM | POA: Diagnosis not present

## 2016-01-13 DIAGNOSIS — G4700 Insomnia, unspecified: Secondary | ICD-10-CM | POA: Diagnosis not present

## 2016-01-13 DIAGNOSIS — E7800 Pure hypercholesterolemia, unspecified: Secondary | ICD-10-CM | POA: Diagnosis not present

## 2016-01-13 DIAGNOSIS — Z1389 Encounter for screening for other disorder: Secondary | ICD-10-CM | POA: Diagnosis not present

## 2016-01-13 DIAGNOSIS — Z23 Encounter for immunization: Secondary | ICD-10-CM | POA: Diagnosis not present

## 2016-01-13 DIAGNOSIS — N529 Male erectile dysfunction, unspecified: Secondary | ICD-10-CM | POA: Diagnosis not present

## 2016-01-13 DIAGNOSIS — G4733 Obstructive sleep apnea (adult) (pediatric): Secondary | ICD-10-CM | POA: Diagnosis not present

## 2016-01-13 DIAGNOSIS — D179 Benign lipomatous neoplasm, unspecified: Secondary | ICD-10-CM | POA: Diagnosis not present

## 2016-01-13 DIAGNOSIS — E668 Other obesity: Secondary | ICD-10-CM | POA: Diagnosis not present

## 2016-01-13 DIAGNOSIS — I444 Left anterior fascicular block: Secondary | ICD-10-CM | POA: Diagnosis not present

## 2016-01-13 DIAGNOSIS — Z6831 Body mass index (BMI) 31.0-31.9, adult: Secondary | ICD-10-CM | POA: Diagnosis not present

## 2016-03-16 DIAGNOSIS — Z23 Encounter for immunization: Secondary | ICD-10-CM | POA: Diagnosis not present

## 2016-05-16 DIAGNOSIS — M79672 Pain in left foot: Secondary | ICD-10-CM | POA: Diagnosis not present

## 2016-05-16 DIAGNOSIS — G8929 Other chronic pain: Secondary | ICD-10-CM | POA: Diagnosis not present

## 2016-05-22 DIAGNOSIS — G8929 Other chronic pain: Secondary | ICD-10-CM | POA: Diagnosis not present

## 2016-05-22 DIAGNOSIS — M79672 Pain in left foot: Secondary | ICD-10-CM | POA: Diagnosis not present

## 2016-05-26 DIAGNOSIS — G8929 Other chronic pain: Secondary | ICD-10-CM | POA: Diagnosis not present

## 2016-05-26 DIAGNOSIS — M25475 Effusion, left foot: Secondary | ICD-10-CM | POA: Diagnosis not present

## 2016-05-26 DIAGNOSIS — M84375A Stress fracture, left foot, initial encounter for fracture: Secondary | ICD-10-CM | POA: Diagnosis not present

## 2016-05-26 DIAGNOSIS — M79672 Pain in left foot: Secondary | ICD-10-CM | POA: Diagnosis not present

## 2016-06-15 DIAGNOSIS — M84375D Stress fracture, left foot, subsequent encounter for fracture with routine healing: Secondary | ICD-10-CM | POA: Diagnosis not present

## 2016-10-09 DIAGNOSIS — Z6831 Body mass index (BMI) 31.0-31.9, adult: Secondary | ICD-10-CM | POA: Diagnosis not present

## 2016-10-09 DIAGNOSIS — J208 Acute bronchitis due to other specified organisms: Secondary | ICD-10-CM | POA: Diagnosis not present

## 2016-10-09 DIAGNOSIS — R05 Cough: Secondary | ICD-10-CM | POA: Diagnosis not present

## 2016-11-16 IMAGING — CT CT CHEST W/ CM
2 of 3 series · 15 of 36 positions shown, 18 images · IV contrast (APPLIED)
Comparison: No prior images. Chest x-ray report dated 12/31/2014
question left hilar mass

CLINICAL DATA: Abnormal outside chest x-ray.  Prominent left hilum

EXAM:
CT CHEST WITH CONTRAST
TECHNIQUE: Multidetector CT imaging of the chest was performed during
intravenous contrast administration.
CONTRAST:  75mL [id number] IOPAMIDOL ([id number]) INJECTION 61%

[Series 3: chest w/cm · axial · 0.88mm/px · z∈[-327,-52]mm · 12 of 65 slices shown, 15 images]
[im 5/65  mediastinal]
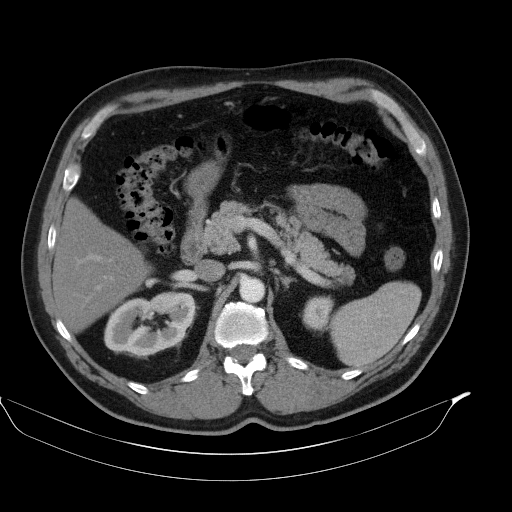
[im 5/65  lung]
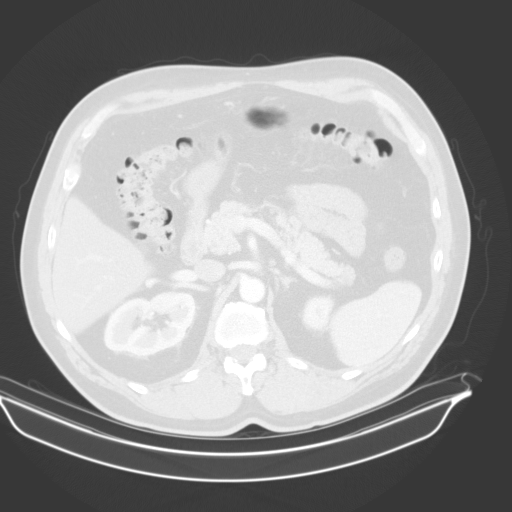
[im 10/65  lung]
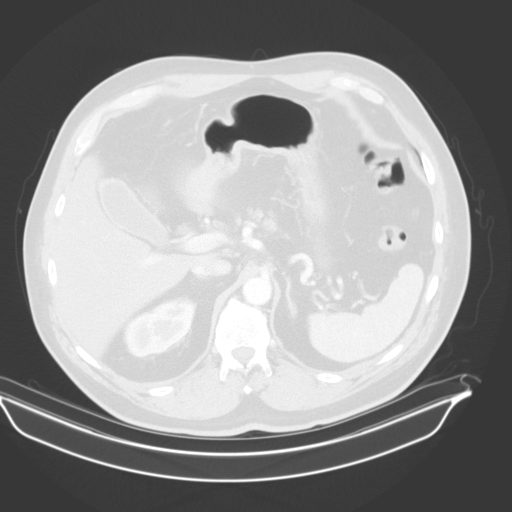
[im 15/65  lung]
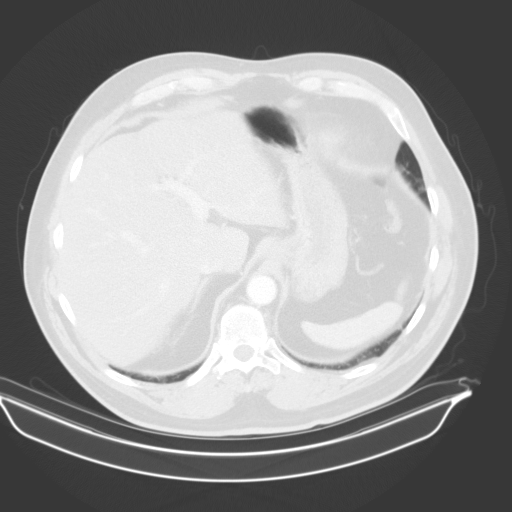
[im 19/65  lung]
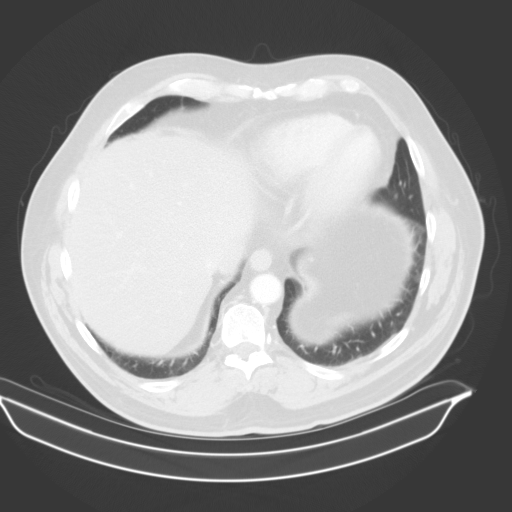
[im 24/65  mediastinal]
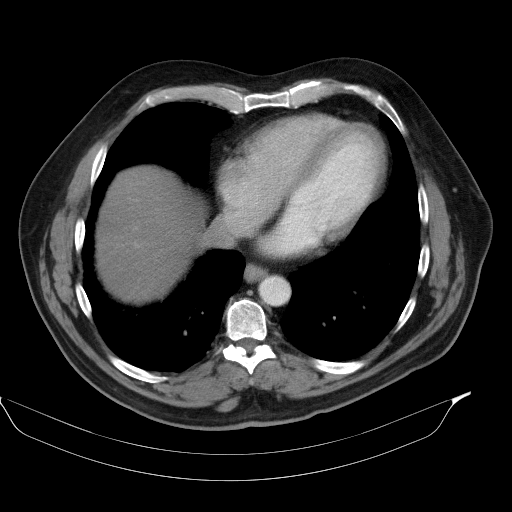
[im 24/65  lung]
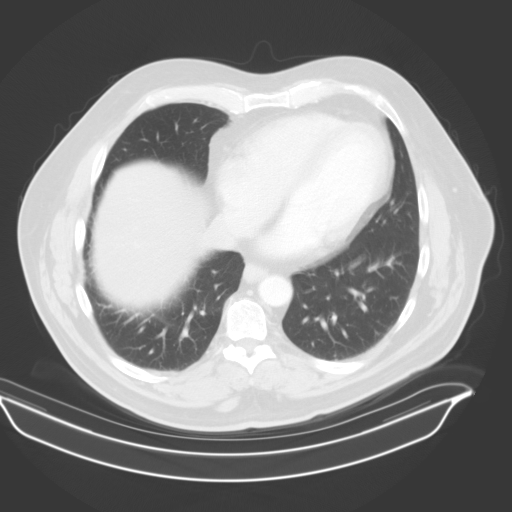
[im 29/65  lung]
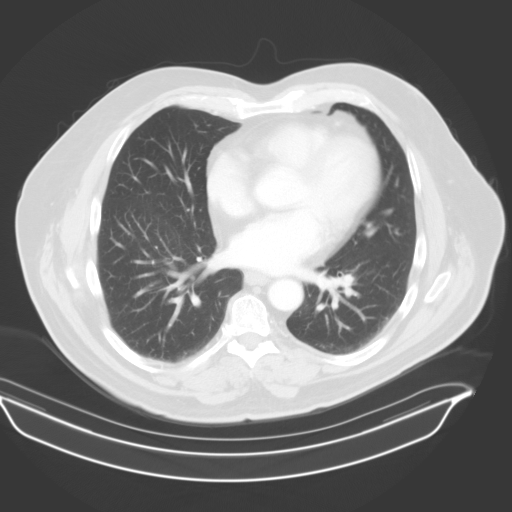
[im 36/65  lung]
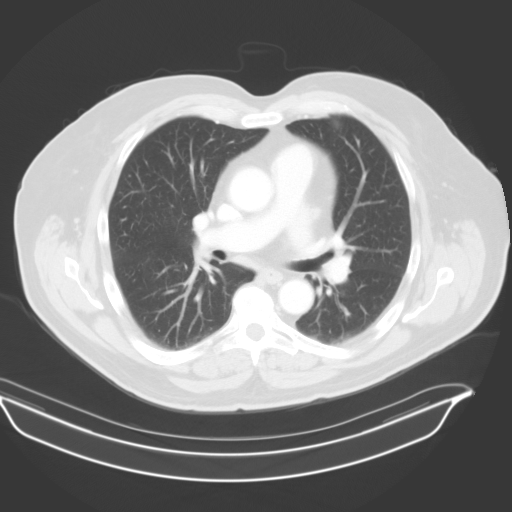
[im 41/65  lung]
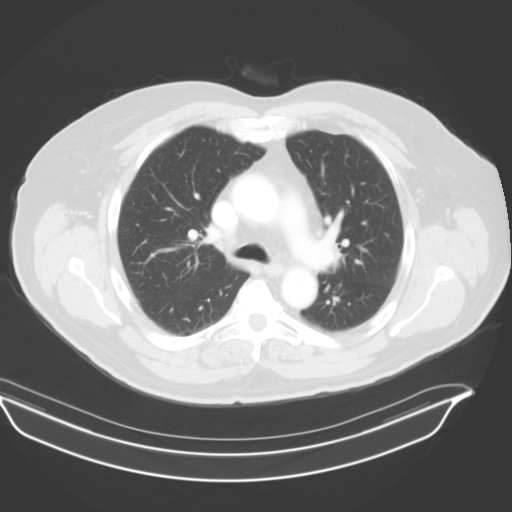
[im 46/65  mediastinal]
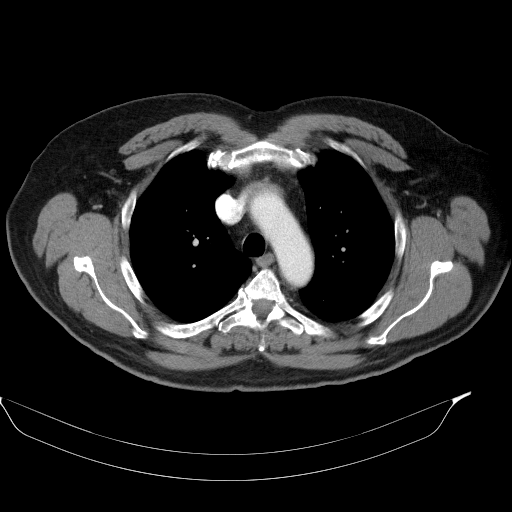
[im 46/65  lung]
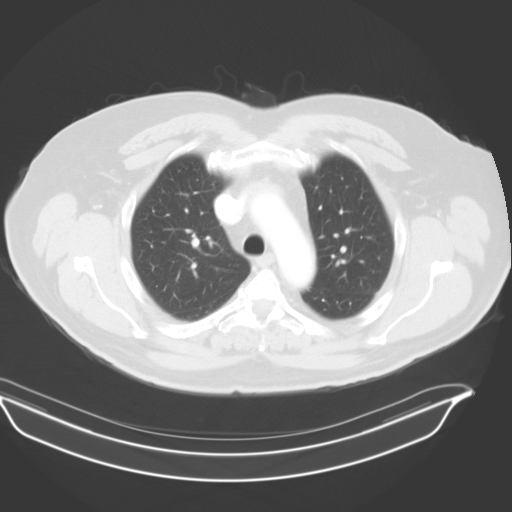
[im 50/65  lung]
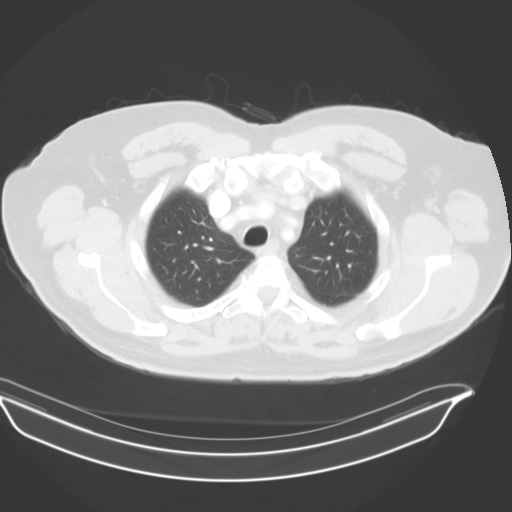
[im 55/65  lung]
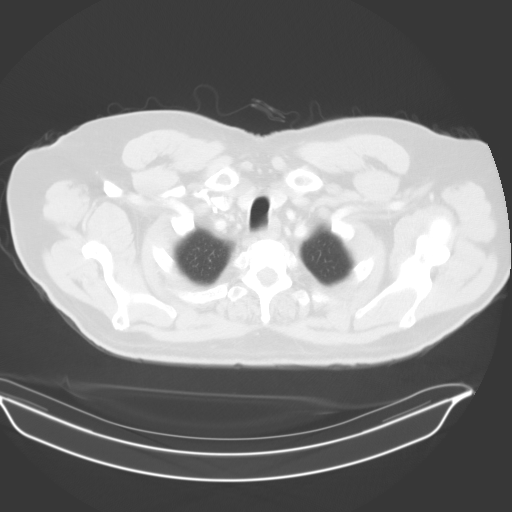
[im 60/65  lung]
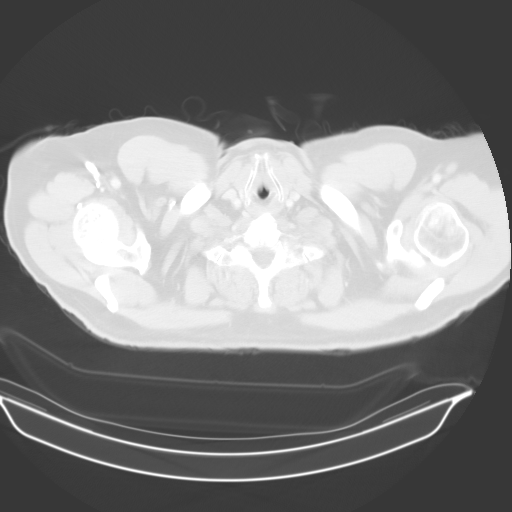

[Series 4: cor · coronal · 0.63mm/px · 3 of 97 slices shown]
[im 20/97  lung]
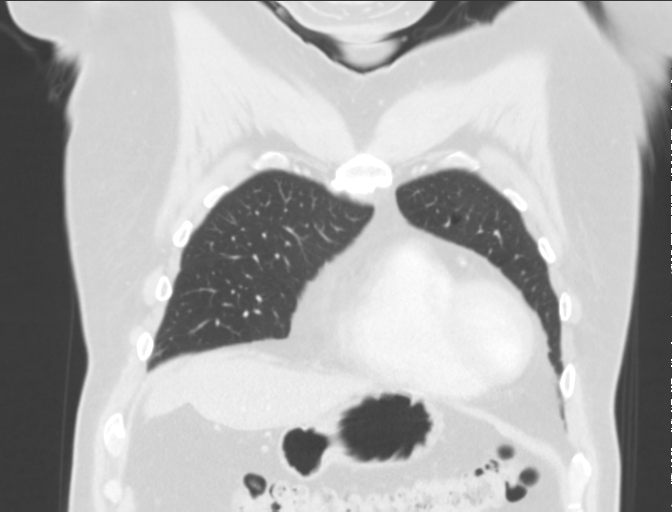
[im 39/97  lung]
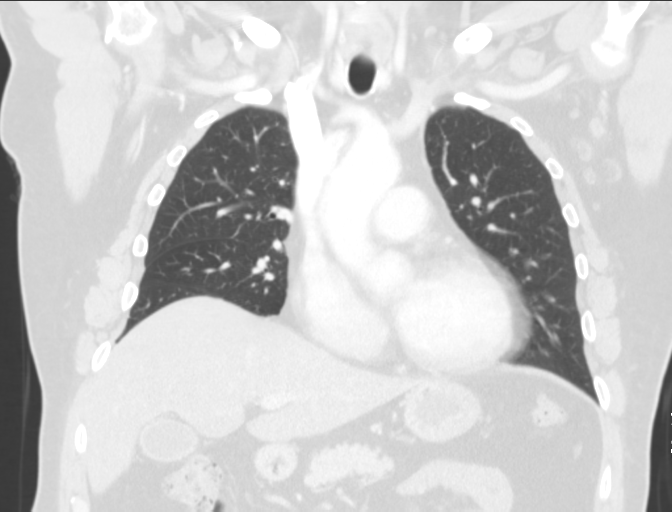
[im 58/97  lung]
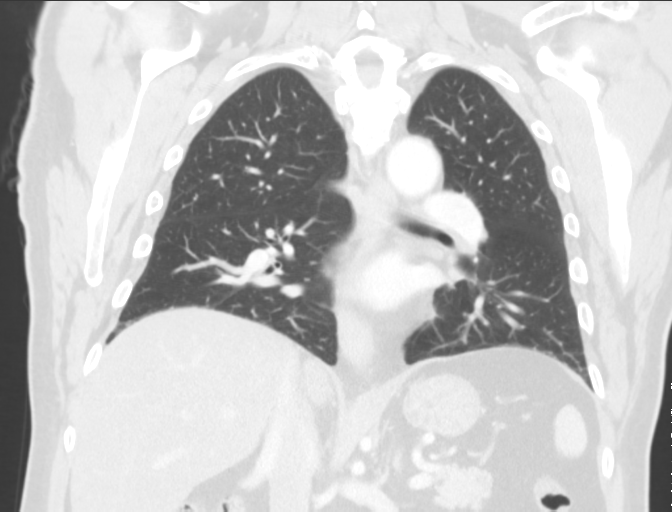

[15 of 36 positions shown; findings below may reference images not displayed]

FINDINGS: The lungs are clear without infiltrate or effusion. Negative for
mass or adenopathy. Left hilum is normal

Pleural-based lipoma is present anteriorly in the left mid lung
zone. This appears benign and measures 29 x 13 mm. This may account
for the density on chest x-ray.

Heart size is within normal limits. Vascularity enhances normal.
Normal aortic arch.

Normal upper abdomen. No acute skeletal abnormality. Mild thoracic
disc degeneration
IMPRESSION: Pleural lipoma anteriorly on the left may account for the chest
x-ray density.

Negative for mass or adenopathy in the chest.  Lungs are clear.

## 2017-02-22 DIAGNOSIS — R82998 Other abnormal findings in urine: Secondary | ICD-10-CM | POA: Diagnosis not present

## 2017-02-22 DIAGNOSIS — Z125 Encounter for screening for malignant neoplasm of prostate: Secondary | ICD-10-CM | POA: Diagnosis not present

## 2017-02-22 DIAGNOSIS — I10 Essential (primary) hypertension: Secondary | ICD-10-CM | POA: Diagnosis not present

## 2017-02-22 DIAGNOSIS — E7800 Pure hypercholesterolemia, unspecified: Secondary | ICD-10-CM | POA: Diagnosis not present

## 2017-03-01 DIAGNOSIS — E7800 Pure hypercholesterolemia, unspecified: Secondary | ICD-10-CM | POA: Diagnosis not present

## 2017-03-01 DIAGNOSIS — I444 Left anterior fascicular block: Secondary | ICD-10-CM | POA: Diagnosis not present

## 2017-03-01 DIAGNOSIS — Z23 Encounter for immunization: Secondary | ICD-10-CM | POA: Diagnosis not present

## 2017-03-01 DIAGNOSIS — Z6831 Body mass index (BMI) 31.0-31.9, adult: Secondary | ICD-10-CM | POA: Diagnosis not present

## 2017-03-01 DIAGNOSIS — I10 Essential (primary) hypertension: Secondary | ICD-10-CM | POA: Diagnosis not present

## 2017-03-01 DIAGNOSIS — Z1389 Encounter for screening for other disorder: Secondary | ICD-10-CM | POA: Diagnosis not present

## 2017-03-01 DIAGNOSIS — E298 Other testicular dysfunction: Secondary | ICD-10-CM | POA: Diagnosis not present

## 2017-03-01 DIAGNOSIS — G4709 Other insomnia: Secondary | ICD-10-CM | POA: Diagnosis not present

## 2017-03-01 DIAGNOSIS — G4733 Obstructive sleep apnea (adult) (pediatric): Secondary | ICD-10-CM | POA: Diagnosis not present

## 2017-03-01 DIAGNOSIS — M79672 Pain in left foot: Secondary | ICD-10-CM | POA: Diagnosis not present

## 2017-03-01 DIAGNOSIS — N528 Other male erectile dysfunction: Secondary | ICD-10-CM | POA: Diagnosis not present

## 2017-03-01 DIAGNOSIS — Z0000 Encounter for general adult medical examination without abnormal findings: Secondary | ICD-10-CM | POA: Diagnosis not present

## 2017-03-12 DIAGNOSIS — M25572 Pain in left ankle and joints of left foot: Secondary | ICD-10-CM | POA: Diagnosis not present

## 2017-03-12 DIAGNOSIS — M216X2 Other acquired deformities of left foot: Secondary | ICD-10-CM | POA: Diagnosis not present

## 2017-03-12 DIAGNOSIS — M7672 Peroneal tendinitis, left leg: Secondary | ICD-10-CM | POA: Diagnosis not present

## 2017-03-12 DIAGNOSIS — M7752 Other enthesopathy of left foot: Secondary | ICD-10-CM | POA: Diagnosis not present

## 2017-03-30 DIAGNOSIS — M7672 Peroneal tendinitis, left leg: Secondary | ICD-10-CM | POA: Diagnosis not present

## 2017-04-03 DIAGNOSIS — M7672 Peroneal tendinitis, left leg: Secondary | ICD-10-CM | POA: Diagnosis not present

## 2017-04-04 DIAGNOSIS — M7672 Peroneal tendinitis, left leg: Secondary | ICD-10-CM | POA: Diagnosis not present

## 2017-04-11 DIAGNOSIS — M7672 Peroneal tendinitis, left leg: Secondary | ICD-10-CM | POA: Diagnosis not present

## 2017-04-12 DIAGNOSIS — M7672 Peroneal tendinitis, left leg: Secondary | ICD-10-CM | POA: Diagnosis not present

## 2018-02-23 DIAGNOSIS — Z23 Encounter for immunization: Secondary | ICD-10-CM | POA: Diagnosis not present

## 2018-03-05 DIAGNOSIS — E7800 Pure hypercholesterolemia, unspecified: Secondary | ICD-10-CM | POA: Diagnosis not present

## 2018-03-05 DIAGNOSIS — I10 Essential (primary) hypertension: Secondary | ICD-10-CM | POA: Diagnosis not present

## 2018-03-05 DIAGNOSIS — Z125 Encounter for screening for malignant neoplasm of prostate: Secondary | ICD-10-CM | POA: Diagnosis not present

## 2018-03-05 DIAGNOSIS — R82998 Other abnormal findings in urine: Secondary | ICD-10-CM | POA: Diagnosis not present

## 2018-03-13 DIAGNOSIS — E7800 Pure hypercholesterolemia, unspecified: Secondary | ICD-10-CM | POA: Diagnosis not present

## 2018-03-13 DIAGNOSIS — Z6831 Body mass index (BMI) 31.0-31.9, adult: Secondary | ICD-10-CM | POA: Diagnosis not present

## 2018-03-13 DIAGNOSIS — E668 Other obesity: Secondary | ICD-10-CM | POA: Diagnosis not present

## 2018-03-13 DIAGNOSIS — Z1389 Encounter for screening for other disorder: Secondary | ICD-10-CM | POA: Diagnosis not present

## 2018-03-13 DIAGNOSIS — I129 Hypertensive chronic kidney disease with stage 1 through stage 4 chronic kidney disease, or unspecified chronic kidney disease: Secondary | ICD-10-CM | POA: Diagnosis not present

## 2018-03-13 DIAGNOSIS — N182 Chronic kidney disease, stage 2 (mild): Secondary | ICD-10-CM | POA: Diagnosis not present

## 2018-03-13 DIAGNOSIS — Z125 Encounter for screening for malignant neoplasm of prostate: Secondary | ICD-10-CM | POA: Diagnosis not present

## 2018-03-13 DIAGNOSIS — Z0000 Encounter for general adult medical examination without abnormal findings: Secondary | ICD-10-CM | POA: Diagnosis not present

## 2018-03-13 DIAGNOSIS — G4733 Obstructive sleep apnea (adult) (pediatric): Secondary | ICD-10-CM | POA: Diagnosis not present

## 2018-03-13 DIAGNOSIS — I10 Essential (primary) hypertension: Secondary | ICD-10-CM | POA: Diagnosis not present

## 2018-03-13 DIAGNOSIS — R808 Other proteinuria: Secondary | ICD-10-CM | POA: Diagnosis not present

## 2018-03-13 DIAGNOSIS — R7301 Impaired fasting glucose: Secondary | ICD-10-CM | POA: Diagnosis not present

## 2018-06-13 DIAGNOSIS — H25013 Cortical age-related cataract, bilateral: Secondary | ICD-10-CM | POA: Diagnosis not present

## 2018-06-13 DIAGNOSIS — H5213 Myopia, bilateral: Secondary | ICD-10-CM | POA: Diagnosis not present

## 2018-06-13 DIAGNOSIS — H2513 Age-related nuclear cataract, bilateral: Secondary | ICD-10-CM | POA: Diagnosis not present

## 2018-11-22 DIAGNOSIS — M25562 Pain in left knee: Secondary | ICD-10-CM | POA: Diagnosis not present

## 2018-11-22 DIAGNOSIS — M79671 Pain in right foot: Secondary | ICD-10-CM | POA: Diagnosis not present

## 2018-12-16 DIAGNOSIS — L859 Epidermal thickening, unspecified: Secondary | ICD-10-CM | POA: Diagnosis not present

## 2018-12-16 DIAGNOSIS — D485 Neoplasm of uncertain behavior of skin: Secondary | ICD-10-CM | POA: Diagnosis not present

## 2018-12-16 DIAGNOSIS — L570 Actinic keratosis: Secondary | ICD-10-CM | POA: Diagnosis not present

## 2018-12-16 DIAGNOSIS — L988 Other specified disorders of the skin and subcutaneous tissue: Secondary | ICD-10-CM | POA: Diagnosis not present

## 2019-02-14 DIAGNOSIS — Z23 Encounter for immunization: Secondary | ICD-10-CM | POA: Diagnosis not present

## 2019-05-08 DIAGNOSIS — Z20822 Contact with and (suspected) exposure to covid-19: Secondary | ICD-10-CM | POA: Diagnosis not present

## 2019-05-09 ENCOUNTER — Ambulatory Visit: Payer: BLUE CROSS/BLUE SHIELD | Attending: Internal Medicine

## 2019-05-09 DIAGNOSIS — Z20822 Contact with and (suspected) exposure to covid-19: Secondary | ICD-10-CM

## 2019-05-11 LAB — NOVEL CORONAVIRUS, NAA: SARS-CoV-2, NAA: DETECTED — AB

## 2019-05-12 ENCOUNTER — Telehealth: Payer: Self-pay | Admitting: Nurse Practitioner

## 2019-05-12 NOTE — Telephone Encounter (Signed)
Called to Discuss with patient about Covid symptoms and the use of bamlanivimab, a monoclonal antibody infusion for those with mild to moderate Covid symptoms and at a high risk of hospitalization.     Pt is qualified for this infusion at the [facility] infusion center due to co-morbid conditions and/or a member of an at-risk group.       Patient declines infusion at this time. He states that he is not having any symptoms.  Symptoms tier reviewed as well as criteria for ending isolation. Preventative practices reviewed. Patient verbalized understanding.    Patient advised to call back if he decides that he does want to get infusion. Callback number to the infusion center given. Patient advised to go to Urgent care or ED with severe symptoms.

## 2019-06-16 DIAGNOSIS — H2513 Age-related nuclear cataract, bilateral: Secondary | ICD-10-CM | POA: Diagnosis not present

## 2019-06-16 DIAGNOSIS — H02831 Dermatochalasis of right upper eyelid: Secondary | ICD-10-CM | POA: Diagnosis not present

## 2019-06-16 DIAGNOSIS — H5213 Myopia, bilateral: Secondary | ICD-10-CM | POA: Diagnosis not present

## 2019-06-16 DIAGNOSIS — H25013 Cortical age-related cataract, bilateral: Secondary | ICD-10-CM | POA: Diagnosis not present

## 2019-06-17 DIAGNOSIS — R7301 Impaired fasting glucose: Secondary | ICD-10-CM | POA: Diagnosis not present

## 2019-06-17 DIAGNOSIS — Z125 Encounter for screening for malignant neoplasm of prostate: Secondary | ICD-10-CM | POA: Diagnosis not present

## 2019-06-17 DIAGNOSIS — E7800 Pure hypercholesterolemia, unspecified: Secondary | ICD-10-CM | POA: Diagnosis not present

## 2019-06-24 DIAGNOSIS — N182 Chronic kidney disease, stage 2 (mild): Secondary | ICD-10-CM | POA: Diagnosis not present

## 2019-06-24 DIAGNOSIS — R7301 Impaired fasting glucose: Secondary | ICD-10-CM | POA: Diagnosis not present

## 2019-06-24 DIAGNOSIS — E291 Testicular hypofunction: Secondary | ICD-10-CM | POA: Diagnosis not present

## 2019-06-24 DIAGNOSIS — E7800 Pure hypercholesterolemia, unspecified: Secondary | ICD-10-CM | POA: Diagnosis not present

## 2019-06-24 DIAGNOSIS — R809 Proteinuria, unspecified: Secondary | ICD-10-CM | POA: Diagnosis not present

## 2019-06-24 DIAGNOSIS — Z1331 Encounter for screening for depression: Secondary | ICD-10-CM | POA: Diagnosis not present

## 2019-06-24 DIAGNOSIS — N529 Male erectile dysfunction, unspecified: Secondary | ICD-10-CM | POA: Diagnosis not present

## 2019-06-24 DIAGNOSIS — G4700 Insomnia, unspecified: Secondary | ICD-10-CM | POA: Diagnosis not present

## 2019-06-24 DIAGNOSIS — Z1339 Encounter for screening examination for other mental health and behavioral disorders: Secondary | ICD-10-CM | POA: Diagnosis not present

## 2019-06-24 DIAGNOSIS — G4733 Obstructive sleep apnea (adult) (pediatric): Secondary | ICD-10-CM | POA: Diagnosis not present

## 2019-06-24 DIAGNOSIS — Z0000 Encounter for general adult medical examination without abnormal findings: Secondary | ICD-10-CM | POA: Diagnosis not present

## 2019-06-24 DIAGNOSIS — Z8616 Personal history of COVID-19: Secondary | ICD-10-CM | POA: Diagnosis not present

## 2019-06-24 DIAGNOSIS — E669 Obesity, unspecified: Secondary | ICD-10-CM | POA: Diagnosis not present

## 2019-06-24 DIAGNOSIS — I129 Hypertensive chronic kidney disease with stage 1 through stage 4 chronic kidney disease, or unspecified chronic kidney disease: Secondary | ICD-10-CM | POA: Diagnosis not present

## 2019-07-01 DIAGNOSIS — R82998 Other abnormal findings in urine: Secondary | ICD-10-CM | POA: Diagnosis not present

## 2019-07-01 DIAGNOSIS — I129 Hypertensive chronic kidney disease with stage 1 through stage 4 chronic kidney disease, or unspecified chronic kidney disease: Secondary | ICD-10-CM | POA: Diagnosis not present

## 2019-07-04 DIAGNOSIS — M13842 Other specified arthritis, left hand: Secondary | ICD-10-CM | POA: Diagnosis not present

## 2019-10-07 DIAGNOSIS — H16042 Marginal corneal ulcer, left eye: Secondary | ICD-10-CM | POA: Diagnosis not present

## 2019-10-08 DIAGNOSIS — H16042 Marginal corneal ulcer, left eye: Secondary | ICD-10-CM | POA: Diagnosis not present

## 2019-10-10 DIAGNOSIS — H16042 Marginal corneal ulcer, left eye: Secondary | ICD-10-CM | POA: Diagnosis not present

## 2019-12-15 DIAGNOSIS — Z20822 Contact with and (suspected) exposure to covid-19: Secondary | ICD-10-CM | POA: Diagnosis not present

## 2020-02-28 DIAGNOSIS — Z23 Encounter for immunization: Secondary | ICD-10-CM | POA: Diagnosis not present

## 2020-04-14 DIAGNOSIS — I10 Essential (primary) hypertension: Secondary | ICD-10-CM | POA: Diagnosis not present

## 2020-05-08 ENCOUNTER — Ambulatory Visit: Payer: Medicare Other | Attending: Internal Medicine

## 2020-05-08 DIAGNOSIS — Z23 Encounter for immunization: Secondary | ICD-10-CM

## 2020-05-08 NOTE — Progress Notes (Signed)
   Covid-19 Vaccination Clinic  Name:  Ryver Poblete    MRN: [id number] DOB: May 09, 1945  05/08/2020  Mr. Abreu was observed post Covid-19 immunization for 15 minutes without incident. He was provided with Vaccine Information Sheet and instruction to access the V-Safe system.   Mr. Allman was instructed to call 911 with any severe reactions post vaccine: Marland Kitchen Difficulty breathing  . Swelling of face and throat  . A fast heartbeat  . A bad rash all over body  . Dizziness and weakness   Immunizations Administered    Name Date Dose VIS Date Route   Pfizer COVID-19 Vaccine 05/08/2020 10:59 AM 0.3 mL 02/18/2020 Intramuscular   Manufacturer: [place]   Lot: [id number]   NDC: [address]-3

## 2020-06-16 DIAGNOSIS — H5213 Myopia, bilateral: Secondary | ICD-10-CM | POA: Diagnosis not present

## 2020-06-16 DIAGNOSIS — H25013 Cortical age-related cataract, bilateral: Secondary | ICD-10-CM | POA: Diagnosis not present

## 2020-06-16 DIAGNOSIS — H16042 Marginal corneal ulcer, left eye: Secondary | ICD-10-CM | POA: Diagnosis not present

## 2020-06-16 DIAGNOSIS — H2513 Age-related nuclear cataract, bilateral: Secondary | ICD-10-CM | POA: Diagnosis not present

## 2020-06-24 DIAGNOSIS — E7800 Pure hypercholesterolemia, unspecified: Secondary | ICD-10-CM | POA: Diagnosis not present

## 2020-06-24 DIAGNOSIS — Z125 Encounter for screening for malignant neoplasm of prostate: Secondary | ICD-10-CM | POA: Diagnosis not present

## 2020-06-24 DIAGNOSIS — R7301 Impaired fasting glucose: Secondary | ICD-10-CM | POA: Diagnosis not present

## 2020-07-01 DIAGNOSIS — Z6832 Body mass index (BMI) 32.0-32.9, adult: Secondary | ICD-10-CM | POA: Diagnosis not present

## 2020-07-01 DIAGNOSIS — Z0000 Encounter for general adult medical examination without abnormal findings: Secondary | ICD-10-CM | POA: Diagnosis not present

## 2020-07-01 DIAGNOSIS — G4700 Insomnia, unspecified: Secondary | ICD-10-CM | POA: Diagnosis not present

## 2020-07-01 DIAGNOSIS — I129 Hypertensive chronic kidney disease with stage 1 through stage 4 chronic kidney disease, or unspecified chronic kidney disease: Secondary | ICD-10-CM | POA: Diagnosis not present

## 2020-07-01 DIAGNOSIS — R7301 Impaired fasting glucose: Secondary | ICD-10-CM | POA: Diagnosis not present

## 2020-07-01 DIAGNOSIS — G4733 Obstructive sleep apnea (adult) (pediatric): Secondary | ICD-10-CM | POA: Diagnosis not present

## 2020-07-01 DIAGNOSIS — N182 Chronic kidney disease, stage 2 (mild): Secondary | ICD-10-CM | POA: Diagnosis not present

## 2020-07-01 DIAGNOSIS — I444 Left anterior fascicular block: Secondary | ICD-10-CM | POA: Diagnosis not present

## 2020-07-01 DIAGNOSIS — R001 Bradycardia, unspecified: Secondary | ICD-10-CM | POA: Diagnosis not present

## 2020-07-01 DIAGNOSIS — E669 Obesity, unspecified: Secondary | ICD-10-CM | POA: Diagnosis not present

## 2020-07-01 DIAGNOSIS — R809 Proteinuria, unspecified: Secondary | ICD-10-CM | POA: Diagnosis not present

## 2020-07-01 DIAGNOSIS — E7800 Pure hypercholesterolemia, unspecified: Secondary | ICD-10-CM | POA: Diagnosis not present

## 2020-10-13 DIAGNOSIS — M542 Cervicalgia: Secondary | ICD-10-CM | POA: Diagnosis not present

## 2020-10-13 DIAGNOSIS — M25512 Pain in left shoulder: Secondary | ICD-10-CM | POA: Diagnosis not present

## 2020-11-05 DIAGNOSIS — M542 Cervicalgia: Secondary | ICD-10-CM | POA: Diagnosis not present

## 2020-11-05 DIAGNOSIS — M47812 Spondylosis without myelopathy or radiculopathy, cervical region: Secondary | ICD-10-CM | POA: Diagnosis not present

## 2020-11-05 DIAGNOSIS — M5030 Other cervical disc degeneration, unspecified cervical region: Secondary | ICD-10-CM | POA: Diagnosis not present

## 2020-11-05 DIAGNOSIS — M5412 Radiculopathy, cervical region: Secondary | ICD-10-CM | POA: Diagnosis not present

## 2021-02-04 DIAGNOSIS — Z23 Encounter for immunization: Secondary | ICD-10-CM | POA: Diagnosis not present

## 2021-02-04 DIAGNOSIS — Z20822 Contact with and (suspected) exposure to covid-19: Secondary | ICD-10-CM | POA: Diagnosis not present

## 2021-02-07 DIAGNOSIS — H16041 Marginal corneal ulcer, right eye: Secondary | ICD-10-CM | POA: Diagnosis not present

## 2021-02-15 DIAGNOSIS — H16041 Marginal corneal ulcer, right eye: Secondary | ICD-10-CM | POA: Diagnosis not present

## 2021-05-16 DIAGNOSIS — R051 Acute cough: Secondary | ICD-10-CM | POA: Diagnosis not present

## 2021-05-16 DIAGNOSIS — I129 Hypertensive chronic kidney disease with stage 1 through stage 4 chronic kidney disease, or unspecified chronic kidney disease: Secondary | ICD-10-CM | POA: Diagnosis not present

## 2021-05-16 DIAGNOSIS — J069 Acute upper respiratory infection, unspecified: Secondary | ICD-10-CM | POA: Diagnosis not present

## 2021-05-16 DIAGNOSIS — G4733 Obstructive sleep apnea (adult) (pediatric): Secondary | ICD-10-CM | POA: Diagnosis not present

## 2021-06-22 DIAGNOSIS — H2513 Age-related nuclear cataract, bilateral: Secondary | ICD-10-CM | POA: Diagnosis not present

## 2021-06-22 DIAGNOSIS — H25013 Cortical age-related cataract, bilateral: Secondary | ICD-10-CM | POA: Diagnosis not present

## 2021-06-22 DIAGNOSIS — H5213 Myopia, bilateral: Secondary | ICD-10-CM | POA: Diagnosis not present

## 2021-07-13 DIAGNOSIS — R7301 Impaired fasting glucose: Secondary | ICD-10-CM | POA: Diagnosis not present

## 2021-07-13 DIAGNOSIS — E7800 Pure hypercholesterolemia, unspecified: Secondary | ICD-10-CM | POA: Diagnosis not present

## 2021-07-13 DIAGNOSIS — E291 Testicular hypofunction: Secondary | ICD-10-CM | POA: Diagnosis not present

## 2021-07-13 DIAGNOSIS — Z125 Encounter for screening for malignant neoplasm of prostate: Secondary | ICD-10-CM | POA: Diagnosis not present

## 2021-07-13 DIAGNOSIS — I10 Essential (primary) hypertension: Secondary | ICD-10-CM | POA: Diagnosis not present

## 2021-07-18 DIAGNOSIS — I444 Left anterior fascicular block: Secondary | ICD-10-CM | POA: Diagnosis not present

## 2021-07-18 DIAGNOSIS — E7800 Pure hypercholesterolemia, unspecified: Secondary | ICD-10-CM | POA: Diagnosis not present

## 2021-07-18 DIAGNOSIS — R82998 Other abnormal findings in urine: Secondary | ICD-10-CM | POA: Diagnosis not present

## 2021-07-18 DIAGNOSIS — R7301 Impaired fasting glucose: Secondary | ICD-10-CM | POA: Diagnosis not present

## 2021-07-18 DIAGNOSIS — Z1339 Encounter for screening examination for other mental health and behavioral disorders: Secondary | ICD-10-CM | POA: Diagnosis not present

## 2021-07-18 DIAGNOSIS — E669 Obesity, unspecified: Secondary | ICD-10-CM | POA: Diagnosis not present

## 2021-07-18 DIAGNOSIS — Z1331 Encounter for screening for depression: Secondary | ICD-10-CM | POA: Diagnosis not present

## 2021-07-18 DIAGNOSIS — N182 Chronic kidney disease, stage 2 (mild): Secondary | ICD-10-CM | POA: Diagnosis not present

## 2021-07-18 DIAGNOSIS — F321 Major depressive disorder, single episode, moderate: Secondary | ICD-10-CM | POA: Diagnosis not present

## 2021-07-18 DIAGNOSIS — I129 Hypertensive chronic kidney disease with stage 1 through stage 4 chronic kidney disease, or unspecified chronic kidney disease: Secondary | ICD-10-CM | POA: Diagnosis not present

## 2021-07-18 DIAGNOSIS — Z1212 Encounter for screening for malignant neoplasm of rectum: Secondary | ICD-10-CM | POA: Diagnosis not present

## 2021-07-18 DIAGNOSIS — R001 Bradycardia, unspecified: Secondary | ICD-10-CM | POA: Diagnosis not present

## 2021-07-18 DIAGNOSIS — Z0000 Encounter for general adult medical examination without abnormal findings: Secondary | ICD-10-CM | POA: Diagnosis not present

## 2021-07-18 DIAGNOSIS — R809 Proteinuria, unspecified: Secondary | ICD-10-CM | POA: Diagnosis not present

## 2021-08-16 DIAGNOSIS — M79645 Pain in left finger(s): Secondary | ICD-10-CM | POA: Diagnosis not present

## 2021-10-19 DIAGNOSIS — M79672 Pain in left foot: Secondary | ICD-10-CM | POA: Diagnosis not present

## 2022-04-30 DIAGNOSIS — J019 Acute sinusitis, unspecified: Secondary | ICD-10-CM | POA: Diagnosis not present

## 2022-04-30 DIAGNOSIS — J029 Acute pharyngitis, unspecified: Secondary | ICD-10-CM | POA: Diagnosis not present

## 2022-06-16 DIAGNOSIS — I10 Essential (primary) hypertension: Secondary | ICD-10-CM | POA: Diagnosis not present

## 2022-06-21 DIAGNOSIS — R809 Proteinuria, unspecified: Secondary | ICD-10-CM | POA: Diagnosis not present

## 2022-06-21 DIAGNOSIS — I129 Hypertensive chronic kidney disease with stage 1 through stage 4 chronic kidney disease, or unspecified chronic kidney disease: Secondary | ICD-10-CM | POA: Diagnosis not present

## 2022-06-21 DIAGNOSIS — R001 Bradycardia, unspecified: Secondary | ICD-10-CM | POA: Diagnosis not present

## 2022-06-21 DIAGNOSIS — N182 Chronic kidney disease, stage 2 (mild): Secondary | ICD-10-CM | POA: Diagnosis not present

## 2022-06-21 DIAGNOSIS — E7800 Pure hypercholesterolemia, unspecified: Secondary | ICD-10-CM | POA: Diagnosis not present

## 2022-06-21 DIAGNOSIS — G4733 Obstructive sleep apnea (adult) (pediatric): Secondary | ICD-10-CM | POA: Diagnosis not present

## 2022-07-19 ENCOUNTER — Institutional Professional Consult (permissible substitution): Payer: Medicare Other | Admitting: Neurology

## 2022-08-01 ENCOUNTER — Encounter: Payer: Self-pay | Admitting: *Deleted

## 2022-08-02 ENCOUNTER — Encounter: Payer: Self-pay | Admitting: Neurology

## 2022-08-02 ENCOUNTER — Ambulatory Visit (INDEPENDENT_AMBULATORY_CARE_PROVIDER_SITE_OTHER): Payer: Medicare Other | Admitting: Neurology

## 2022-08-02 VITALS — BP 148/80 | HR 46 | Ht 73.5 in | Wt 262.4 lb

## 2022-08-02 DIAGNOSIS — R001 Bradycardia, unspecified: Secondary | ICD-10-CM | POA: Diagnosis not present

## 2022-08-02 DIAGNOSIS — N529 Male erectile dysfunction, unspecified: Secondary | ICD-10-CM | POA: Diagnosis not present

## 2022-08-02 DIAGNOSIS — Z1212 Encounter for screening for malignant neoplasm of rectum: Secondary | ICD-10-CM | POA: Diagnosis not present

## 2022-08-02 DIAGNOSIS — I129 Hypertensive chronic kidney disease with stage 1 through stage 4 chronic kidney disease, or unspecified chronic kidney disease: Secondary | ICD-10-CM | POA: Diagnosis not present

## 2022-08-02 DIAGNOSIS — Z125 Encounter for screening for malignant neoplasm of prostate: Secondary | ICD-10-CM | POA: Diagnosis not present

## 2022-08-02 DIAGNOSIS — G4719 Other hypersomnia: Secondary | ICD-10-CM

## 2022-08-02 DIAGNOSIS — R635 Abnormal weight gain: Secondary | ICD-10-CM | POA: Diagnosis not present

## 2022-08-02 DIAGNOSIS — E7800 Pure hypercholesterolemia, unspecified: Secondary | ICD-10-CM | POA: Diagnosis not present

## 2022-08-02 DIAGNOSIS — R351 Nocturia: Secondary | ICD-10-CM

## 2022-08-02 DIAGNOSIS — R7301 Impaired fasting glucose: Secondary | ICD-10-CM | POA: Diagnosis not present

## 2022-08-02 DIAGNOSIS — G4733 Obstructive sleep apnea (adult) (pediatric): Secondary | ICD-10-CM | POA: Diagnosis not present

## 2022-08-02 DIAGNOSIS — I10 Essential (primary) hypertension: Secondary | ICD-10-CM | POA: Diagnosis not present

## 2022-08-02 DIAGNOSIS — E291 Testicular hypofunction: Secondary | ICD-10-CM | POA: Diagnosis not present

## 2022-08-02 NOTE — Patient Instructions (Addendum)
Thank you for choosing [facility] for your sleep related care! It was nice to meet you today!   Here is what we discussed today:    Based on your symptoms and your exam I believe you are still at risk for obstructive sleep apnea (aka OSA). We should proceed with a sleep study to determine whether you do or do not have OSA and how severe it is. Even, if you have mild OSA, I may want you to consider treatment with CPAP, as treatment of even borderline or mild sleep apnea can result and improvement of symptoms such as sleep disruption, daytime sleepiness, nighttime bathroom breaks, restless leg symptoms, improvement of headache syndromes, even improved mood disorder.   As explained, an attended sleep study (meaning you get to stay overnight in the sleep lab), lets [place] monitor sleep-related behaviors such as sleep talking and leg movements in sleep, in addition to monitoring for sleep apnea.  A home sleep test is a screening tool for sleep apnea diagnosis only, but unfortunately, does not help with any other sleep-related diagnoses.  For convenience and interest of time: I will order a home sleep test for you. We will try to expedite.   Please remember, the long-term risks and ramifications of untreated moderate to severe obstructive sleep apnea may include (but are not limited to): increased risk for cardiovascular disease, including congestive heart failure, stroke, difficult to control hypertension, treatment resistant obesity, arrhythmias, especially irregular heartbeat commonly known as A. Fib. (atrial fibrillation); even type 2 diabetes has been linked to untreated OSA.   Other correlations that untreated obstructive sleep apnea include macular edema which is swelling of the retina in the eyes, droopy eyelid syndrome, and elevated hemoglobin and hematocrit levels (often referred to as polycythemia).  Sleep apnea can cause disruption of sleep and sleep deprivation in most cases,  which, in turn, can cause recurrent headaches, problems with memory, mood, concentration, focus, and vigilance. Most people with untreated sleep apnea report excessive daytime sleepiness, which can affect their ability to drive. Please do not drive or use heavy equipment or machinery, if you feel sleepy! Patients with sleep apnea can also develop difficulty initiating and maintaining sleep (aka insomnia).   Having sleep apnea may increase your risk for other sleep disorders, including involuntary behaviors sleep such as sleep terrors, sleep talking, sleepwalking.    Having sleep apnea can also increase your risk for restless leg syndrome and leg movements at night.   Please note that untreated obstructive sleep apnea may carry additional perioperative morbidity. Patients with significant obstructive sleep apnea (typically, in the moderate to severe degree) should receive, if possible, perioperative PAP (positive airway pressure) therapy and the surgeons and particularly the anesthesiologists should be informed of the diagnosis and the severity of the sleep disordered breathing.   We will call you or email you through [place] with regards to your test results and plan a follow-up in sleep clinic accordingly. Most likely, you will hear from one of our nurses.   Our sleep lab administrative assistant will call you to schedule your sleep study and give you further instructions, regarding the check in process for the sleep study, arrival time, what to bring, when you can expect to leave after the study, etc., and to answer any other logistical questions you may have. If you don't hear back from her by about 2 weeks from now, please feel free to call her direct line at [phone] or you can call our general clinic number,  or email [place] through My Chart.

## 2022-08-02 NOTE — Progress Notes (Signed)
Subjective:    Patient ID: Raymond Marshall is a 77 y.o. male.  HPI    Star Age, MD, PhD [facility] [address], [address] [place], [place] [address]  Dear Dr. Osborne Casco,  I saw your patient, Raymond Marshall, upon your kind request in my sleep clinic today for evaluation of his obstructive sleep apnea.  The patient is accompanied by his wife today.  As you know, Raymond Marshall is a 77 year old male with an underlying medical history of hypertension, hyperlipidemia, hypogonadism, bradycardia, history of COVID-19, cervical spine disease, and obesity, who was previously diagnosed with obstructive sleep apnea and was on PAP therapy.  He reports snoring and excessive daytime somnolence.  He has had gradual weight gain over time.  He has lack of energy and nonrestorative sleep.  His Epworth sleepiness score is 16 out of 24, fatigue severity score is 37 out of 63.  He has had some shortness of breath and is going to see cardiology this month.  He has noticed some ankle swelling as well.  I reviewed your office note from 06/21/22.   I had evaluated him several years ago for sleep apnea concern.  He had a baseline sleep study on 02/12/2013 which showed an AHI of 7.7/h, more pronounced during supine and REM sleep.  Average oxygen saturation was 91%, nadir was 84%.  He had a CPAP titration study on 03/19/2013.  He was started on home CPAP therapy at a pressure of 10 cm. He no longer uses CPAP.  He had difficulty with the mask with side sleeping position.  Has not used his machine in years and no longer has the machine even.  Bedtime is generally around 8:30 PM or 9 PM and rise time around 6:30 AM.  He works in his business part-time, his son has been working full-time in [employer].  Patient is trying to phase out altogether.  He denies any significant stress.  He has tried melatonin in the past to help him sleep but it caused him daytime drowsiness.  Generally  speaking, he falls asleep okay.  He drinks caffeine in the form of soda occasionally, and tea occasionally.  He drinks alcohol up to twice a week, up to 3-4 drinks at a time.  He is a non-smoker.  He lives with his wife.  They have 2 grown children, daughter lives in [place].  He is not aware of any family history of sleep apnea.  His wife suspects that their son may have it.  He denies nocturnal or morning headaches.  Sometimes he wakes up with a soreness in his jaw, he has a history of jaw clenching, he has a bite guard but has not used it.  He has nocturia about once per average night.  Previously:   05/28/13: 77 year old right-handed gentleman with an underlying medical history of obesity, hyperlipidemia, and cervical spine disease, who presents for followup consultation of his obstructive sleep apnea. He is unaccompanied today. I first met him on 01/16/2013, at which time he reported loud snoring and excessive daytime somnolence as well as leg kicking while asleep. I suggested a sleep study and he came back for baseline sleep study on 02/12/2013, followed by a CPAP titration study on 03/19/2013. I went over his test results with him in detail today. His baseline sleep study on 02/12/2013 showed a sleep efficiency at 68.4% with a normal latency to sleep of 11.5 minutes and a high wake after sleep onset of 143  minutes with severe sleep fragmentation noted. He had an increased arousal index. This was because of respiratory events and periodic leg movements. He had increased percentages were stage I and stage II sleep, near absence of slow-wave sleep and a decreased percentage of REM sleep with a markedly prolonged REM latency of 322.5 minutes. He had severe periodic leg movements at 78.9 per hour and an associated arousal index of 9.9 per hour. He had occasional PVCs on EKG. He had mild to moderate snoring. His total AHI was 7.7 per hour, rising to 43 per hour and REM sleep and nearly 17 per hour in the supine  position. Baseline oxygen saturation was only 91%, with a nadir of 84%. He spent 1 hour and 18 minutes below the saturation of 90% for the night. He was requested to return for CPAP titration study which he had on 03/19/2013. His sleep efficiency was near normal at 89.3% with a normal latency to sleep and wake after sleep onset of 43.5 minutes with mild sleep fragmentation noted. He had increased percentages of light stage sleep, he had near absence of slow-wave sleep, and a decreased percentage of REM sleep with a mildly prolonged REM latency. He again was noted to have severe periodic leg movements at 72.8 per hour resulting in an arousal index of 7.3 per hour. He again was noted to have occasional PVCs. His average oxygen saturation was 92%, his nadir was 85% and he spent 9 minutes and 15 seconds below the saturation of 90%. He was titrated from 5-10 cm of CPAP and had a residual AHI of 0 per hour at the final pressure with supine REM sleep achieved. I reviewed compliance data from 04/11/2013 through 05/10/2013 which is a total of 30 days during which time he used CPAP every night, percent used days greater than 4 hours was 97%, indicating excellent compliance. His average usage was 7 hours and 45 minutes. Residual AHI was 2.9 per hour on the set pressure of 10 cm with EPR of 2. However, his leak was at times quite high with the 95th percentile of 34.3 L per minute.  Today, I reviewed the compliance data off his machine: 04/11/2013 through 05/27/2013 which is a total of 47 days during which time his CPAP every night. Percent used days greater than 4 hours was 96%, indicating excellent compliance. AHI was 2.8. His leak again was at times quite high. The 95th percentile was 35 L per minute. His pressures at 10 with EPR of 2. The average usage for all days was 7 hours and 43 minutes.   Today, he reports resting much better and sleeping better with CPAP, having adjusted well to treatment. He does not have to take  a nap in the afternoon. He does not have RLS symptoms, but his wife reports, that he does not kick in his sleep as much. He has no new complaints and feels, he is doing well. He is using a nasal pillows mask. He has had no recent illness or changes in his medications. He continues to work full time. He denies any palpitations or chest pain or any cardiac symptoms. He does not have any edema.   He had a HST on 01/06/13, reported on 01/10/13, with an est. AHI of 23/h and min. O2 saturation of 87.7%.       His Past Medical History Is Significant For: Past Medical History:  Diagnosis Date   Cervical spine disease    injections by Dr. Nelva Bush   COVID-19  05/2019 minimal sx, no sequelae   ED (erectile dysfunction)    High cholesterol    Hypertension    Hypogonadism, male    Obesity    OSA (obstructive sleep apnea)    prior cpap, (refused to continue 2016)    His Past Surgical History Is Significant For: Past Surgical History:  Procedure Laterality Date   KNEE SURGERY Left    ROTATOR CUFF REPAIR  2012    His Family History Is Significant For: Family History  Problem Relation Age of Onset   Diabetes Mother    Cerebral palsy Sister    Cancer Maternal Grandmother    Peripheral vascular disease Paternal Grandmother        s/p amputation    His Social History Is Significant For: Social History   Socioeconomic History   Marital status: Married    Spouse name: Bonnita Nasuti   Number of children: 2   Years of education: Assoc. Deg   Highest education level: Not on file  Occupational History    Comment: [employer]  Tobacco Use   Smoking status: Former    Types: Cigars   Smokeless tobacco: Never   Tobacco comments:    2 or 3 times monthly  Vaping Use   Vaping Use: Never used  Substance and Sexual Activity   Alcohol use: Yes    Comment: 3-4 alcohol drinks per week   Drug use: No   Sexual activity: Not on file  Other Topics Concern   Not on file  Social History Narrative   Patient  lives at home with spouse.Caffeine Use: 3-5 times week.   Works (own business)   AD education   Children 2   Social Determinants of Radio broadcast assistant Strain: Not on file  Food Insecurity: Not on file  Transportation Needs: Not on file  Physical Activity: Not on file  Stress: Not on file  Social Connections: Not on file    His Allergies Are:  No Known Allergies:   His Current Medications Are:  Outpatient Encounter Medications as of 08/02/2022  Medication Sig   amLODipine (NORVASC) 5 MG tablet Take 5 mg by mouth daily.   aspirin EC 81 MG tablet Take 81 mg by mouth daily. Swallow whole.   atorvastatin (LIPITOR) 10 MG tablet Take 10 mg by mouth daily.   benazepril (LOTENSIN) 40 MG tablet Take 40 mg by mouth daily.   naproxen sodium (ALEVE) 220 MG tablet Take 220 mg by mouth daily as needed.   [DISCONTINUED] benazepril (LOTENSIN) 20 MG tablet Take 20 mg by mouth 2 (two) times daily.   [DISCONTINUED] ibuprofen (ADVIL) 200 MG tablet Take 200 mg by mouth every 6 (six) hours as needed.   No facility-administered encounter medications on file as of 08/02/2022.  :   Review of Systems:  Out of a complete 14 point review of systems, all are reviewed and negative with the exception of these symptoms as listed below:  Review of Systems  Neurological:        Hx OSA, used cpap for awhile could not use as was side sleeper.  No machine now.  He has gained weight, notices some shortness of breath, to see cardiology. Has a lot of fatigue.  ESS 16, FSS 37.    Objective:  Neurological Exam  Physical Exam Physical Examination:   Vitals:   08/02/22 0911 08/02/22 0923  BP: (!) 154/86 (!) 148/80  Pulse: (!) 49 (!) 46    General Examination: The patient is a  very pleasant 77 y.o. male in no acute distress. He appears well-developed and well-nourished and very well groomed.   HEENT: Normocephalic, atraumatic, pupils are equal, round and reactive to light, corrective eyeglasses in  place.  Extraocular tracking is good without limitation to gaze excursion or nystagmus noted. Hearing is grossly intact. Face is symmetric with normal facial animation. Speech is clear with no dysarthria noted. There is no hypophonia. There is no lip, neck/head, jaw or voice tremor. Neck is supple with full range of passive and active motion. There are no carotid bruits on auscultation. Oropharynx exam reveals: mild mouth dryness, adequate dental hygiene and moderate airway crowding, due to small airway entry and thicker tongue.  Tonsils not fully visualized, Mallampati class III.  Neck circumference 18 5/8 inches.  Minimal overbite.  Tongue protrudes centrally and palate elevates symmetrically.  Chest: Clear to auscultation without wheezing, rhonchi or crackles noted.  Heart: S1+S2+0, regular and normal without murmurs, rubs or gallops noted.  Bradycardia noted.  Abdomen: Soft, non-tender and non-distended.  Extremities: There is trace to 1+ pitting edema around the left ankle and distal lower extremity, trace edema in the right ankle area.    Skin: Warm and dry without trophic changes noted.   Musculoskeletal: exam reveals no obvious joint deformities.   Neurologically:  Mental status: The patient is awake, alert and oriented in all 4 spheres. His immediate and remote memory, attention, language skills and fund of knowledge are appropriate. There is no evidence of aphasia, agnosia, apraxia or anomia. Speech is clear with normal prosody and enunciation. Thought process is linear. Mood is normal and affect is normal.  Cranial nerves II - XII are as described above under HEENT exam.  Motor exam: Normal bulk, strength and tone is noted. There is no obvious action or resting tremor.  Fine motor skills and coordination: grossly intact.  Cerebellar testing: No dysmetria or intention tremor. There is no truncal or gait ataxia.  Sensory exam: intact to light touch in the upper and lower extremities.   Gait, station and balance: He stands easily. No veering to one side is noted. No leaning to one side is noted. Posture is age-appropriate and stance is narrow based. Gait shows normal stride length and normal pace. No problems turning are noted.   Assessment and Plan:  In summary, Raymond Marshall is a very pleasant 77 y.o.-year old male with an underlying medical history of hypertension, hyperlipidemia, hypogonadism, bradycardia, history of COVID-19, cervical spine disease, and obesity, who presents for evaluation of his obstructive sleep apnea.  He was diagnosed several years ago with overall mild sleep apnea, more pronounced during REM sleep in supine sleep.  He had difficulty tolerating the mask primarily when he used CPAP in the past.  He would be willing to get reevaluated and consider PAP therapy again.  We talked about the improvements in the machines and the mask options in particular.  While a laboratory attended sleep study is typically considered "gold standard" for evaluation of sleep disordered breathing, we mutually agreed to pursue a home sleep test at this time for reevaluation.  Ideally, he would like to get tested before his upcoming cardiology appointment later this month.   I had a long chat with the patient and his wife about my findings and the diagnosis of sleep apnea, particularly OSA, its prognosis and treatment options. We talked about medical/conservative treatments, surgical interventions and non-pharmacological approaches for symptom control. I explained, in particular, the risks and ramifications of untreated  moderate to severe OSA, especially with respect to developing cardiovascular disease down the road, including congestive heart failure (CHF), difficult to treat hypertension, cardiac arrhythmias (particularly A-fib), neurovascular complications including TIA, stroke and dementia. Even type 2 diabetes has, in part, been linked to untreated OSA. Symptoms of untreated OSA may  include (but may not be limited to) daytime sleepiness, nocturia (i.e. frequent nighttime urination), memory problems, mood irritability and suboptimally controlled or worsening mood disorder such as depression and/or anxiety, lack of energy, lack of motivation, physical discomfort, as well as recurrent headaches, especially morning or nocturnal headaches. We talked about the importance of maintaining a healthy lifestyle and striving for healthy weight. I recommended a sleep study at this time. I outlined the differences between a laboratory attended sleep study which is considered more comprehensive and accurate over the option of a home sleep test (HST); the latter may lead to underestimation of sleep disordered breathing in some instances and does not help with diagnosing upper airway resistance syndrome and is not accurate enough to diagnose primary central sleep apnea typically. I outlined possible surgical and non-surgical treatment options of OSA, including the use of a positive airway pressure (PAP) device (i.e. CPAP, AutoPAP/APAP or BiPAP in certain circumstances), a custom-made dental device (aka oral appliance, which would require a referral to a specialist dentist or orthodontist typically, and is generally speaking not considered for patients with full dentures or edentulous state), upper airway surgical options, such as traditional UPPP (which is not considered a first-line treatment) or the Inspire device (hypoglossal nerve stimulator, which would involve a referral for consultation with an ENT surgeon, after careful selection, following inclusion criteria - also not first-line treatment). I explained the PAP treatment option to the patient in detail, as this is generally considered first-line treatment.  The patient indicated that he would be willing to try PAP therapy, if the need arises. I explained the importance of being compliant with PAP treatment, not only for insurance purposes but primarily  to improve patient's symptoms symptoms, and for the patient's long term health benefit, including to reduce His cardiovascular risks longer-term.    We will pick up our discussion about the next steps and treatment options after testing.  We will keep them posted as to the test results by phone call and/or MyChart messaging where possible.  We will plan to follow-up in sleep clinic accordingly as well.  I answered all their questions today and the patient and his wife were in agreement.   I encouraged them to call with any interim questions, concerns, problems or updates or email [place] through [facility].  Generally speaking, sleep test authorizations may take up to 2 weeks, sometimes less, sometimes longer, the patient is encouraged to get in touch with [place] if they do not hear back from the sleep lab staff directly within the next 2 weeks.  Thank you very much for allowing me to participate in the care of this nice patient. If I can be of any further assistance to you please do not hesitate to call me at [phone].  Sincerely,   Star Age, MD, PhD

## 2022-08-10 NOTE — Progress Notes (Signed)
Referring-Richard Tisovec MD Reason for referral-dyspnea  HPI: 77 year old male for evaluation of dyspnea at request of Guerry Bruin MD.  Stress echocardiogram June 2011 normal.  Patient states that for the past 1 year he has noticed worsening dyspnea on exertion.  There is no orthopnea, PND, chest pain or syncope.  He was initiated on amlodipine approximately 2 to 3 months ago.  He has noticed worsening pedal edema since that time.  Cardiology now asked to evaluate.  Current Outpatient Medications  Medication Sig Dispense Refill   aspirin EC 81 MG tablet Take 81 mg by mouth daily. Swallow whole.     atorvastatin (LIPITOR) 10 MG tablet Take 10 mg by mouth daily.     benazepril (LOTENSIN) 40 MG tablet Take 40 mg by mouth daily.     naproxen sodium (ALEVE) 220 MG tablet Take 220 mg by mouth daily as needed.     No current facility-administered medications for this visit.    No Known Allergies   Past Medical History:  Diagnosis Date   Cervical spine disease    injections by Dr. Ethelene Hal   COVID-19    05/2019 minimal sx, no sequelae   ED (erectile dysfunction)    High cholesterol    Hypertension    Hypogonadism, male    Obesity    OSA (obstructive sleep apnea)    prior cpap, (refused to continue 2016)    Past Surgical History:  Procedure Laterality Date   KNEE SURGERY Left    ROTATOR CUFF REPAIR  2012    Social History   Socioeconomic History   Marital status: Married    Spouse name: Myriam Jacobson   Number of children: 2   Years of education: Assoc. Deg   Highest education level: Not on file  Occupational History    Comment: [employer]  Tobacco Use   Smoking status: Never   Smokeless tobacco: Never   Tobacco comments:    2 or 3 times monthly  Vaping Use   Vaping Use: Never used  Substance and Sexual Activity   Alcohol use: Yes    Comment: 3-4 alcohol drinks per week   Drug use: No   Sexual activity: Not on file  Other Topics Concern   Not on file  Social  History Narrative   Patient lives at home with spouse.Caffeine Use: 3-5 times week.   Works (own business)   AD education   Children 2   Social Determinants of Corporate investment banker Strain: Not on file  Food Insecurity: Not on file  Transportation Needs: Not on file  Physical Activity: Not on file  Stress: Not on file  Social Connections: Not on file  Intimate Partner Violence: Not on file    Family History  Problem Relation Age of Onset   Diabetes Mother    Cerebral palsy Sister    Cancer Maternal Grandmother    Peripheral vascular disease Paternal Grandmother        s/p amputation    ROS: no fevers or chills, productive cough, hemoptysis, dysphasia, odynophagia, melena, hematochezia, dysuria, hematuria, rash, seizure activity, orthopnea, PND, claudication. Remaining systems are negative.  Physical Exam:   Blood pressure 134/84, pulse (!) 53, height 6' 1.5" (1.867 m), weight 261 lb 3.2 oz (118.5 kg), SpO2 95 %.  General:  Well developed/well nourished in NAD Skin warm/dry Patient not depressed No peripheral clubbing Back-normal HEENT-normal/normal eyelids Neck supple/normal carotid upstroke bilaterally; no bruits; no JVD; no thyromegaly chest - CTA/ normal expansion  CV - RRR/normal S1 and S2; no murmurs, rubs or gallops;  PMI nondisplaced Abdomen -NT/ND, no HSM, no mass, + bowel sounds, no bruit 2+ femoral pulses, no bruits Ext-1+ edema, chords, 2+ DP Neuro-grossly nonfocal  ECG -sinus bradycardia at a rate of 53, first-degree AV block, no significant ST changes.  Personally reviewed  A/P  1 dyspnea on exertion-patient describes worsening dyspnea on exertion over the past 1 year.  I will arrange an echocardiogram to assess LV function.  I am also concerned about anginal equivalent.  We will schedule cardiac CTA to rule out obstructive coronary disease.  2 hypertension-blood pressure is borderline.  However he has developed pedal edema over the past 2 months  that is likely secondary to amlodipine.  I will discontinue and instead treat with spironolactone 25 mg daily.  In 1 week check potassium and renal function.  Follow blood pressure and adjust medications as needed.  Note he does have some bradycardia and will avoid beta-blockade.  3 hyperlipidemia-continue statin.  4 obstructive sleep apnea-presently in the process of being reevaluated.  He did not tolerate CPAP in the past.  Olga MillersBrian Tiarrah Saville, MD

## 2022-08-16 DIAGNOSIS — N182 Chronic kidney disease, stage 2 (mild): Secondary | ICD-10-CM | POA: Diagnosis not present

## 2022-08-16 DIAGNOSIS — Z0000 Encounter for general adult medical examination without abnormal findings: Secondary | ICD-10-CM | POA: Diagnosis not present

## 2022-08-16 DIAGNOSIS — R82998 Other abnormal findings in urine: Secondary | ICD-10-CM | POA: Diagnosis not present

## 2022-08-16 DIAGNOSIS — E7800 Pure hypercholesterolemia, unspecified: Secondary | ICD-10-CM | POA: Diagnosis not present

## 2022-08-16 DIAGNOSIS — E1122 Type 2 diabetes mellitus with diabetic chronic kidney disease: Secondary | ICD-10-CM | POA: Diagnosis not present

## 2022-08-16 DIAGNOSIS — I129 Hypertensive chronic kidney disease with stage 1 through stage 4 chronic kidney disease, or unspecified chronic kidney disease: Secondary | ICD-10-CM | POA: Diagnosis not present

## 2022-08-16 DIAGNOSIS — G4733 Obstructive sleep apnea (adult) (pediatric): Secondary | ICD-10-CM | POA: Diagnosis not present

## 2022-08-16 DIAGNOSIS — E669 Obesity, unspecified: Secondary | ICD-10-CM | POA: Diagnosis not present

## 2022-08-16 DIAGNOSIS — R809 Proteinuria, unspecified: Secondary | ICD-10-CM | POA: Diagnosis not present

## 2022-08-16 DIAGNOSIS — Z1331 Encounter for screening for depression: Secondary | ICD-10-CM | POA: Diagnosis not present

## 2022-08-16 DIAGNOSIS — I444 Left anterior fascicular block: Secondary | ICD-10-CM | POA: Diagnosis not present

## 2022-08-16 DIAGNOSIS — Z1339 Encounter for screening examination for other mental health and behavioral disorders: Secondary | ICD-10-CM | POA: Diagnosis not present

## 2022-08-16 DIAGNOSIS — N529 Male erectile dysfunction, unspecified: Secondary | ICD-10-CM | POA: Diagnosis not present

## 2022-08-17 ENCOUNTER — Telehealth: Payer: Self-pay | Admitting: Neurology

## 2022-08-17 NOTE — Telephone Encounter (Signed)
HST- Medicare/bcbs supp no auth req   Patient is scheduled at [facility] For 09/05/22 at 8:30 AM  Mailed packet to the patient.

## 2022-08-22 ENCOUNTER — Ambulatory Visit: Payer: Medicare Other | Attending: Cardiology | Admitting: Cardiology

## 2022-08-22 ENCOUNTER — Encounter: Payer: Self-pay | Admitting: Cardiology

## 2022-08-22 VITALS — BP 134/84 | HR 53 | Ht 73.5 in | Wt 261.2 lb

## 2022-08-22 DIAGNOSIS — R0609 Other forms of dyspnea: Secondary | ICD-10-CM | POA: Insufficient documentation

## 2022-08-22 DIAGNOSIS — R9431 Abnormal electrocardiogram [ECG] [EKG]: Secondary | ICD-10-CM

## 2022-08-22 DIAGNOSIS — I10 Essential (primary) hypertension: Secondary | ICD-10-CM | POA: Insufficient documentation

## 2022-08-22 MED ORDER — SPIRONOLACTONE 25 MG PO TABS
25.0000 mg | ORAL_TABLET | Freq: Every day | ORAL | 3 refills | Status: DC
Start: 2022-08-22 — End: 2023-08-09

## 2022-08-22 MED ORDER — METOPROLOL TARTRATE 25 MG PO TABS: ORAL_TABLET | ORAL | 0 refills | Status: AC

## 2022-08-22 NOTE — Patient Instructions (Signed)
Medication Instructions:   STOP AMLODIPINE   START SPIRONOLACTONE 25 MG ONCE DAILY  *If you need a refill on your cardiac medications before your next appointment, please call your pharmacy*   Lab Work:  Your physician recommends that you return for lab work in: ONE WEEK-DO NOT NEED TO FAST  If you have labs (blood work) drawn today and your tests are completely normal, you will receive your results only by: MyChart Message (if you have MyChart) OR A paper copy in the mail If you have any lab test that is abnormal or we need to change your treatment, we will call you to review the results.   Testing/Procedures:  Your physician has requested that you have an echocardiogram. Echocardiography is a painless test that uses sound waves to create images of your heart. It provides your doctor with information about the size and shape of your heart and how well your heart's chambers and valves are working. This procedure takes approximately one hour. There are no restrictions for this procedure. Please do NOT wear cologne, perfume, aftershave, or lotions (deodorant is allowed). Please arrive 15 minutes prior to your appointment time. [address]     Your cardiac CT will be scheduled at   [facility] [address] [place], [place] [address] [phone]  If scheduled at [facility], please arrive at the [facility] and Children's Entrance (Entrance C2) of [facility] 30 minutes prior to test start time. You can use the FREE valet parking offered at entrance C (encouraged to control the heart rate for the test)  Proceed to the [facility] Radiology Department (first floor) to check-in and test prep.  All radiology patients and guests should use entrance C2 at [facility], accessed from [facility], even though the hospital's physical address listed is [address]       Please follow these instructions carefully  (unless otherwise directed):  Hold all erectile dysfunction medications at least 3 days (72 hrs) prior to test. (Ie viagra, cialis, sildenafil, tadalafil, etc) We will administer nitroglycerin during this exam.   On the Night Before the Test: Be sure to Drink plenty of water. Do not consume any caffeinated/decaffeinated beverages or chocolate 12 hours prior to your test. Do not take any antihistamines 12 hours prior to your test.   On the Day of the Test: Drink plenty of water until 1 hour prior to the test. Do not eat any food 1 hour prior to test. You may take your regular medications prior to the test.  Take metoprolol (Lopressor) 25 MG two hours prior to test.       DO NOT TAKE SPIRONOLACTONE THE MORNING OF THE CT       After the Test: Drink plenty of water. After receiving IV contrast, you may experience a mild flushed feeling. This is normal. On occasion, you may experience a mild rash up to 24 hours after the test. This is not dangerous. If this occurs, you can take Benadryl 25 mg and increase your fluid intake. If you experience trouble breathing, this can be serious. If it is severe call 911 IMMEDIATELY. If it is mild, please call our office.  We will call to schedule your test 2-4 weeks out understanding that some insurance companies will need an authorization prior to the service being performed.   For non-scheduling related questions, please contact the cardiac imaging nurse navigator should you have any questions/concerns: Rockwell Alexandria, Cardiac  Imaging Nurse Navigator Larey Brick, Cardiac Imaging Nurse Navigator [facility] Direct Office Dial: [phone]   For scheduling needs, including cancellations and rescheduling, please call [place], [phone].    Follow-Up: At [facility], you and your health needs are our priority.  As part of our continuing mission to provide you with exceptional heart care, we have created  designated Provider Care Teams.  These Care Teams include your primary Cardiologist (physician) and Advanced Practice Providers (APPs -  Physician Assistants and Nurse Practitioners) who all work together to provide you with the care you need, when you need it.  We recommend signing up for the patient portal called "MyChart".  Sign up information is provided on this After Visit Summary.  MyChart is used to connect with patients for Virtual Visits (Telemedicine).  Patients are able to view lab/test results, encounter notes, upcoming appointments, etc.  Non-urgent messages can be sent to your provider as well.   To learn more about what you can do with MyChart, go to [email or web address].    Your next appointment:   6 month(s)  Provider:   Olga Millers MD

## 2022-08-25 DIAGNOSIS — H5213 Myopia, bilateral: Secondary | ICD-10-CM | POA: Diagnosis not present

## 2022-08-25 DIAGNOSIS — H2513 Age-related nuclear cataract, bilateral: Secondary | ICD-10-CM | POA: Diagnosis not present

## 2022-08-25 DIAGNOSIS — H25013 Cortical age-related cataract, bilateral: Secondary | ICD-10-CM | POA: Diagnosis not present

## 2022-08-31 DIAGNOSIS — R0609 Other forms of dyspnea: Secondary | ICD-10-CM | POA: Diagnosis not present

## 2022-08-31 LAB — BASIC METABOLIC PANEL
BUN/Creatinine Ratio: 15 (ref 10–24)
BUN: 13 mg/dL (ref 8–27)
CO2: 23 mmol/L (ref 20–29)
Calcium: 9.4 mg/dL (ref 8.6–10.2)
Chloride: 104 mmol/L (ref 96–106)
Creatinine, Ser: 0.84 mg/dL (ref 0.76–1.27)
Glucose: 138 mg/dL — ABNORMAL HIGH (ref 70–99)
Potassium: 4.6 mmol/L (ref 3.5–5.2)
Sodium: 139 mmol/L (ref 134–144)
eGFR: 92 mL/min/{1.73_m2} (ref >59)

## 2022-09-05 ENCOUNTER — Ambulatory Visit (INDEPENDENT_AMBULATORY_CARE_PROVIDER_SITE_OTHER): Payer: Medicare Other | Admitting: Neurology

## 2022-09-05 DIAGNOSIS — R635 Abnormal weight gain: Secondary | ICD-10-CM

## 2022-09-05 DIAGNOSIS — R001 Bradycardia, unspecified: Secondary | ICD-10-CM

## 2022-09-05 DIAGNOSIS — G4733 Obstructive sleep apnea (adult) (pediatric): Secondary | ICD-10-CM

## 2022-09-05 DIAGNOSIS — G4734 Idiopathic sleep related nonobstructive alveolar hypoventilation: Secondary | ICD-10-CM

## 2022-09-05 DIAGNOSIS — R351 Nocturia: Secondary | ICD-10-CM

## 2022-09-05 DIAGNOSIS — G4719 Other hypersomnia: Secondary | ICD-10-CM

## 2022-09-07 NOTE — Progress Notes (Signed)
See procedure note.

## 2022-09-11 NOTE — Addendum Note (Signed)
Addended by: Huston Foley on: 09/11/2022 06:46 PM   Modules accepted: Orders

## 2022-09-11 NOTE — Procedures (Signed)
[facility]  HOME SLEEP TEST (Watch PAT) REPORT  STUDY DATE: 09/05/22  DOB: 02-Feb-1946  MRN: [id number]  ORDERING CLINICIAN: Huston Foley, MD, PhD   REFERRING CLINICIAN: Tisovec, Adelfa Koh, MD   CLINICAL INFORMATION/HISTORY: 77 year old male with an underlying medical history of hypertension, hyperlipidemia, hypogonadism, bradycardia, history of COVID-19, cervical spine disease, and obesity, who was previously diagnosed with obstructive sleep apnea and was on PAP therapy. He no longer uses a PAP machine.  Epworth sleepiness score: 16/24.  BMI: 33.7 kg/m  FINDINGS:   Sleep Summary:   Total Recording Time (hours, min): 8 hours, 20 min  Total Sleep Time (hours, min):  7 hours, 48 min  Percent REM (%):    19.5%   Respiratory Indices:   Calculated pAHI (per hour):  38.6/hour - utilizing the 4% desaturation criteria for hypopneas per Medicare guideline     REM pAHI:    55.7/hour       NREM pAHI: 34.5/hour  Central pAHI: 3.9/hour  Oxygen Saturation Statistics:    Oxygen Saturation (%) Mean: 92%   Minimum oxygen saturation (%):                 81%   O2 Saturation Range (%): 81 - 98%    O2 Saturation (minutes) <=88%: 13 min  Pulse Rate Statistics:   Pulse Mean (bpm):    55/min    Pulse Range (30 - 91/min)   IMPRESSION: OSA (obstructive sleep apnea), severe Nocturnal Hypoxemia  RECOMMENDATION:  This home sleep test demonstrates severe obstructive sleep apnea with a total AHI of 38.6/hour and O2 nadir of 81% with significant time below or at 88% saturation of over 10 minutes for the night, indicating nocturnal hypoxemia.  Snoring was detected, in the mild to moderate range, at times louder.  Treatment with positive airway pressure is highly recommended. The patient will be advised to proceed with an autoPAP titration/trial at home. A laboratory attended titration study can be considered in the future for optimization of treatment settings and to  improve tolerance and compliance. Alternative treatment options are limited secondary to the severity of the patient's sleep disordered breathing, but may include surgical treatment with an implantable hypoglossal nerve stimulator (in carefully selected candidates, meeting criteria).  Concomitant weight loss is recommended, where clinically appropriate. Please note, that untreated obstructive sleep apnea may carry additional perioperative morbidity. Patients with significant obstructive sleep apnea should receive perioperative PAP therapy and the surgeons and particularly the anesthesiologist should be informed of the diagnosis and the severity of the sleep disordered breathing. The patient should be cautioned not to drive, work at heights, or operate dangerous or heavy equipment when tired or sleepy. Review and reiteration of good sleep hygiene measures should be pursued with any patient. Other causes of the patient's symptoms, including circadian rhythm disturbances, an underlying mood disorder, medication effect and/or an underlying medical problem cannot be ruled out based on this test. Clinical correlation is recommended.  The patient and his referring provider will be notified of the test results. The patient will be seen in follow up in sleep clinic at [facility].  I certify that I have reviewed the raw data recording prior to the issuance of this report in accordance with the standards of the American Academy of Sleep Medicine (AASM).    INTERPRETING PHYSICIAN:   Huston Foley, MD, PhD Medical Director, [place] [facility]) Diplomat, ABPN (Neurology and Sleep)   [facility] [address], [address]  [place], [place] [address] [phone]

## 2022-09-12 ENCOUNTER — Telehealth: Payer: Self-pay | Admitting: *Deleted

## 2022-09-12 NOTE — Telephone Encounter (Signed)
LMVM for pt to return call for sleep study results.  

## 2022-09-12 NOTE — Telephone Encounter (Signed)
-----   Message from Huston Foley, MD sent at 09/11/2022  6:46 PM EDT ----- Urgent set up requested on PAP therapy, due to severe OSA. Patient referred by PCP for reevaluation of his OSA, seen by me on 08/02/2022, patient had a HST on 09/05/2022.    Please call and notify the patient that the recent home sleep test showed obstructive sleep apnea in the severe range. I recommend treatment for this in the form of autoPAP, which means, that we don't have to bring him in for a sleep study with CPAP, but will let him start using a so called autoPAP machine at home, through a DME company (of his choice, or as per insurance requirement). The DME representative will fit the patient with a mask of choice, educate him on how to use the machine, how to put the mask on, etc. I have placed an order in the chart. Please send the order to a local DME, talk to patient, send report to referring MD. Please also reinforce the need for compliance with treatment. We will need a FU in sleep clinic for 10 weeks post-PAP set up, please arrange that with me or one of our NPs. Thanks,   Huston Foley, MD, PhD [facility])

## 2022-09-13 ENCOUNTER — Encounter: Payer: Self-pay | Admitting: *Deleted

## 2022-09-19 ENCOUNTER — Ambulatory Visit (INDEPENDENT_AMBULATORY_CARE_PROVIDER_SITE_OTHER): Payer: Medicare Other

## 2022-09-19 DIAGNOSIS — R0609 Other forms of dyspnea: Secondary | ICD-10-CM | POA: Diagnosis not present

## 2022-09-19 LAB — ECHOCARDIOGRAM COMPLETE
Area-P 1/2: 2.83 cm2
MV M vel: 3.1 m/s
MV Peak grad: 38.4 mmHg
P 1/2 time: 509 msec
S' Lateral: 2.47 cm

## 2022-09-19 NOTE — Telephone Encounter (Signed)
Spoke to patient states will call office back for sleep study results

## 2022-09-19 NOTE — Telephone Encounter (Signed)
-----   Message from Saima Athar, MD sent at 09/11/2022  6:46 PM EDT ----- Urgent set up requested on PAP therapy, due to severe OSA. Patient referred by PCP for reevaluation of his OSA, seen by me on 08/02/2022, patient had a HST on 09/05/2022.    Please call and notify the patient that the recent home sleep test showed obstructive sleep apnea in the severe range. I recommend treatment for this in the form of autoPAP, which means, that we don't have to bring him in for a sleep study with CPAP, but will let him start using a so called autoPAP machine at home, through a DME company (of his choice, or as per insurance requirement). The DME representative will fit the patient with a mask of choice, educate him on how to use the machine, how to put the mask on, etc. I have placed an order in the chart. Please send the order to a local DME, talk to patient, send report to referring MD. Please also reinforce the need for compliance with treatment. We will need a FU in sleep clinic for 10 weeks post-PAP set up, please arrange that with me or one of our NPs. Thanks,   Saima Athar, MD, PhD [facility] ([facility])     

## 2022-09-20 NOTE — Telephone Encounter (Signed)
I called pt. I advised pt that Dr. Frances Furbish reviewed their sleep study results and found that pt has severe OSA. Dr. Frances Furbish recommends that pt start autopap. I reviewed PAP compliance expectations with the pt. Pt is agreeable to starting an auto-PAP. I advised pt that an order will be sent to a DME, [facility], and they will call the pt within about one week after they file with the pt's insurance. They will show the pt how to use the machine, fit for masks, and troubleshoot the auto-PAP if needed. A follow up appt will be made for insurance purposes with [place] once he receives machine.   Pt verbalized understanding of results. Pt had no questions at this time but was encouraged to call back if questions arise. I have sent the order to [facility] and have received confirmation that they have received the order.

## 2022-09-20 NOTE — Telephone Encounter (Signed)
-----   Message from Saima Athar, MD sent at 09/11/2022  6:46 PM EDT ----- Urgent set up requested on PAP therapy, due to severe OSA. Patient referred by PCP for reevaluation of his OSA, seen by me on 08/02/2022, patient had a HST on 09/05/2022.    Please call and notify the patient that the recent home sleep test showed obstructive sleep apnea in the severe range. I recommend treatment for this in the form of autoPAP, which means, that we don't have to bring him in for a sleep study with CPAP, but will let him start using a so called autoPAP machine at home, through a DME company (of his choice, or as per insurance requirement). The DME representative will fit the patient with a mask of choice, educate him on how to use the machine, how to put the mask on, etc. I have placed an order in the chart. Please send the order to a local DME, talk to patient, send report to referring MD. Please also reinforce the need for compliance with treatment. We will need a FU in sleep clinic for 10 weeks post-PAP set up, please arrange that with me or one of our NPs. Thanks,   Saima Athar, MD, PhD [facility] ([facility])     

## 2022-10-03 ENCOUNTER — Telehealth (HOSPITAL_COMMUNITY): Payer: Self-pay | Admitting: *Deleted

## 2022-10-03 NOTE — Telephone Encounter (Signed)
Reaching out to patient to offer assistance regarding upcoming cardiac imaging study; pt verbalizes understanding of appt date/time, parking situation and where to check in, pre-test NPO status, and verified current allergies; name and call back number provided for further questions should they arise  Larey Brick RN Navigator Cardiac Imaging [facility] [phone] office [phone] cell  Patient to check his HR two hours prior to his cardiac CT scan and only take metoprolol if his HR is greater than 65bpm. Patient reports HR is generally in the 50's.  He is aware to arrive at 9:30am.

## 2022-10-04 ENCOUNTER — Ambulatory Visit (HOSPITAL_COMMUNITY)
Admission: RE | Admit: 2022-10-04 | Discharge: 2022-10-04 | Disposition: A | Discharge status: Home / Self Care | Payer: Medicare Other | Source: Ambulatory Visit | Attending: Cardiology | Admitting: Cardiology

## 2022-10-04 DIAGNOSIS — I2510 Atherosclerotic heart disease of native coronary artery without angina pectoris: Secondary | ICD-10-CM | POA: Diagnosis not present

## 2022-10-04 DIAGNOSIS — R9431 Abnormal electrocardiogram [ECG] [EKG]: Secondary | ICD-10-CM

## 2022-10-04 DIAGNOSIS — R0609 Other forms of dyspnea: Secondary | ICD-10-CM

## 2022-10-04 MED ORDER — IOHEXOL 350 MG/ML SOLN
100.0000 mL | Freq: Once | INTRAVENOUS | Status: AC | PRN
  Administered 2022-10-04: 100 mL via INTRAVENOUS

## 2022-10-04 MED ORDER — NITROGLYCERIN 0.4 MG SL SUBL
0.8000 mg | SUBLINGUAL_TABLET | SUBLINGUAL | Status: DC | PRN
  Administered 2022-10-04: 0.8 mg via SUBLINGUAL

## 2022-10-04 MED ORDER — NITROGLYCERIN 0.4 MG SL SUBL
SUBLINGUAL_TABLET | SUBLINGUAL | Status: AC
  Filled 2022-10-04: qty 2

## 2022-10-05 ENCOUNTER — Telehealth: Payer: Self-pay | Admitting: *Deleted

## 2022-10-05 DIAGNOSIS — E785 Hyperlipidemia, unspecified: Secondary | ICD-10-CM

## 2022-10-05 MED ORDER — ATORVASTATIN CALCIUM 40 MG PO TABS
40.0000 mg | ORAL_TABLET | Freq: Every day | ORAL | 3 refills | Status: DC
Start: 2022-10-05 — End: 2023-07-23

## 2022-10-05 NOTE — Telephone Encounter (Signed)
Spoke with pt, Aware of dr crenshaw's recommendations.  ?New script sent to the pharmacy  ?Lab orders mailed to the pt  ?

## 2022-10-05 NOTE — Telephone Encounter (Signed)
-----   Message from Lewayne Bunting, MD sent at 10/05/2022  8:33 AM EDT ----- Mild CAD, continue ASA, increase lipitor to 40 mg daily; lipids and liver 8 weeks Olga Millers

## 2022-10-24 ENCOUNTER — Telehealth: Payer: Self-pay | Admitting: Neurology

## 2022-10-24 NOTE — Telephone Encounter (Signed)
Pt was scheduled for his initial CPAP on (12/20/22) Pt was informed to bring machine and power cord to the appointment.   DME and between dates are in pt's SnapShot.

## 2022-12-04 DIAGNOSIS — E785 Hyperlipidemia, unspecified: Secondary | ICD-10-CM | POA: Diagnosis not present

## 2022-12-07 ENCOUNTER — Telehealth: Payer: Self-pay | Admitting: *Deleted

## 2022-12-07 DIAGNOSIS — E785 Hyperlipidemia, unspecified: Secondary | ICD-10-CM

## 2022-12-07 MED ORDER — EZETIMIBE 10 MG PO TABS
10.0000 mg | ORAL_TABLET | Freq: Every day | ORAL | 3 refills | Status: DC
Start: 2022-12-07 — End: 2023-11-30

## 2022-12-07 NOTE — Telephone Encounter (Signed)
-----   Message from Olga Millers sent at 12/05/2022  7:18 AM EDT ----- LDL not at goal; add zetia 10 mg daily; lipids and liver 8 weeks Olga Millers

## 2022-12-07 NOTE — Telephone Encounter (Signed)
Pt has reviewed results via my chart  New script sent to the pharmacy  Lab orders mailed to the pt  

## 2022-12-20 ENCOUNTER — Ambulatory Visit (INDEPENDENT_AMBULATORY_CARE_PROVIDER_SITE_OTHER): Payer: Medicare Other | Admitting: Adult Health

## 2022-12-20 ENCOUNTER — Encounter: Payer: Self-pay | Admitting: Adult Health

## 2022-12-20 VITALS — BP 161/90 | HR 53 | Ht 73.0 in | Wt 260.0 lb

## 2022-12-20 DIAGNOSIS — G4733 Obstructive sleep apnea (adult) (pediatric): Secondary | ICD-10-CM | POA: Diagnosis not present

## 2022-12-20 NOTE — Patient Instructions (Signed)
Continue using CPAP nightly and greater than 4 hours each night °If your symptoms worsen or you develop new symptoms please let us know.  ° °

## 2022-12-28 DIAGNOSIS — M9903 Segmental and somatic dysfunction of lumbar region: Secondary | ICD-10-CM | POA: Diagnosis not present

## 2022-12-28 DIAGNOSIS — M9901 Segmental and somatic dysfunction of cervical region: Secondary | ICD-10-CM | POA: Diagnosis not present

## 2022-12-28 DIAGNOSIS — M542 Cervicalgia: Secondary | ICD-10-CM | POA: Diagnosis not present

## 2022-12-28 DIAGNOSIS — M5136 Other intervertebral disc degeneration, lumbar region: Secondary | ICD-10-CM | POA: Diagnosis not present

## 2022-12-28 DIAGNOSIS — M50222 Other cervical disc displacement at C5-C6 level: Secondary | ICD-10-CM | POA: Diagnosis not present

## 2022-12-28 DIAGNOSIS — M5459 Other low back pain: Secondary | ICD-10-CM | POA: Diagnosis not present

## 2022-12-28 DIAGNOSIS — R293 Abnormal posture: Secondary | ICD-10-CM | POA: Diagnosis not present

## 2023-01-04 DIAGNOSIS — M5459 Other low back pain: Secondary | ICD-10-CM | POA: Diagnosis not present

## 2023-01-04 DIAGNOSIS — M50222 Other cervical disc displacement at C5-C6 level: Secondary | ICD-10-CM | POA: Diagnosis not present

## 2023-01-04 DIAGNOSIS — M9901 Segmental and somatic dysfunction of cervical region: Secondary | ICD-10-CM | POA: Diagnosis not present

## 2023-01-04 DIAGNOSIS — M5136 Other intervertebral disc degeneration, lumbar region: Secondary | ICD-10-CM | POA: Diagnosis not present

## 2023-01-04 DIAGNOSIS — M542 Cervicalgia: Secondary | ICD-10-CM | POA: Diagnosis not present

## 2023-01-04 DIAGNOSIS — M9903 Segmental and somatic dysfunction of lumbar region: Secondary | ICD-10-CM | POA: Diagnosis not present

## 2023-01-04 DIAGNOSIS — R293 Abnormal posture: Secondary | ICD-10-CM | POA: Diagnosis not present

## 2023-01-11 DIAGNOSIS — M50222 Other cervical disc displacement at C5-C6 level: Secondary | ICD-10-CM | POA: Diagnosis not present

## 2023-01-11 DIAGNOSIS — M9903 Segmental and somatic dysfunction of lumbar region: Secondary | ICD-10-CM | POA: Diagnosis not present

## 2023-01-11 DIAGNOSIS — R293 Abnormal posture: Secondary | ICD-10-CM | POA: Diagnosis not present

## 2023-01-11 DIAGNOSIS — M5136 Other intervertebral disc degeneration, lumbar region: Secondary | ICD-10-CM | POA: Diagnosis not present

## 2023-01-11 DIAGNOSIS — M542 Cervicalgia: Secondary | ICD-10-CM | POA: Diagnosis not present

## 2023-01-11 DIAGNOSIS — M5459 Other low back pain: Secondary | ICD-10-CM | POA: Diagnosis not present

## 2023-01-11 DIAGNOSIS — M9901 Segmental and somatic dysfunction of cervical region: Secondary | ICD-10-CM | POA: Diagnosis not present

## 2023-02-19 ENCOUNTER — Ambulatory Visit: Payer: Medicare Other | Attending: Physician Assistant | Admitting: Physician Assistant

## 2023-02-19 NOTE — Progress Notes (Signed)
This encounter was created in error - please disregard.

## 2023-03-06 ENCOUNTER — Encounter: Payer: Self-pay | Admitting: *Deleted

## 2023-03-12 DIAGNOSIS — E785 Hyperlipidemia, unspecified: Secondary | ICD-10-CM | POA: Diagnosis not present

## 2023-03-13 LAB — LIPID PANEL
Chol/HDL Ratio: 3 {ratio} (ref 0.0–5.0)
Cholesterol, Total: 114 mg/dL (ref 100–199)
HDL: 38 mg/dL — ABNORMAL LOW (ref >39)
LDL Chol Calc (NIH): 56 mg/dL (ref 0–99)
Triglycerides: 109 mg/dL (ref 0–149)
VLDL Cholesterol Cal: 20 mg/dL (ref 5–40)

## 2023-03-13 LAB — HEPATIC FUNCTION PANEL
ALT: 52 [IU]/L — ABNORMAL HIGH (ref 0–44)
AST: 42 [IU]/L — ABNORMAL HIGH (ref 0–40)
Albumin: 4.2 g/dL (ref 3.8–4.8)
Alkaline Phosphatase: 87 [IU]/L (ref 44–121)
Bilirubin Total: 0.6 mg/dL (ref 0.0–1.2)
Bilirubin, Direct: 0.25 mg/dL (ref 0.00–0.40)
Total Protein: 7.2 g/dL (ref 6.0–8.5)

## 2023-03-15 ENCOUNTER — Other Ambulatory Visit: Payer: Self-pay | Admitting: *Deleted

## 2023-03-15 DIAGNOSIS — Z79899 Other long term (current) drug therapy: Secondary | ICD-10-CM

## 2023-07-11 DIAGNOSIS — Z79899 Other long term (current) drug therapy: Secondary | ICD-10-CM | POA: Diagnosis not present

## 2023-07-11 LAB — HEPATIC FUNCTION PANEL
ALT: 62 IU/L — ABNORMAL HIGH (ref 0–44)
AST: 43 IU/L — ABNORMAL HIGH (ref 0–40)
Albumin: 4.3 g/dL (ref 3.8–4.8)
Alkaline Phosphatase: 77 IU/L (ref 44–121)
Bilirubin Total: 0.7 mg/dL (ref 0.0–1.2)
Bilirubin, Direct: 0.32 mg/dL (ref 0.00–0.40)
Total Protein: 7 g/dL (ref 6.0–8.5)

## 2023-07-12 ENCOUNTER — Encounter: Payer: Self-pay | Admitting: *Deleted

## 2023-07-23 ENCOUNTER — Telehealth: Payer: Self-pay | Admitting: Cardiology

## 2023-07-23 DIAGNOSIS — E785 Hyperlipidemia, unspecified: Secondary | ICD-10-CM

## 2023-07-23 DIAGNOSIS — Z79899 Other long term (current) drug therapy: Secondary | ICD-10-CM

## 2023-07-23 MED ORDER — ATORVASTATIN CALCIUM 20 MG PO TABS
20.0000 mg | ORAL_TABLET | Freq: Every day | ORAL | 3 refills | Status: AC
Start: 2023-07-23 — End: 2023-10-30

## 2023-07-23 NOTE — Telephone Encounter (Signed)
   Patient identification verified by 2 forms. Marilynn Rail, RN    Called and spoke to patient  Relayed provider message  Informed patient updated prescription sent to pharmacy  Patient aware to present to lab in 8 weeks  Patient verbalized understanding, no questions

## 2023-07-23 NOTE — Telephone Encounter (Signed)
 Pt called in asking to speak with Dr. Ludwig Clarks nurse. Did not wish to go into detail.

## 2023-07-24 ENCOUNTER — Other Ambulatory Visit: Payer: Self-pay | Admitting: *Deleted

## 2023-07-24 DIAGNOSIS — E785 Hyperlipidemia, unspecified: Secondary | ICD-10-CM

## 2023-07-24 DIAGNOSIS — R748 Abnormal levels of other serum enzymes: Secondary | ICD-10-CM

## 2023-08-06 DIAGNOSIS — L03114 Cellulitis of left upper limb: Secondary | ICD-10-CM | POA: Diagnosis not present

## 2023-08-06 DIAGNOSIS — M7150 Other bursitis, not elsewhere classified, unspecified site: Secondary | ICD-10-CM | POA: Diagnosis not present

## 2023-08-09 ENCOUNTER — Other Ambulatory Visit: Payer: Self-pay | Admitting: Cardiology

## 2023-08-09 DIAGNOSIS — M7022 Olecranon bursitis, left elbow: Secondary | ICD-10-CM | POA: Diagnosis not present

## 2023-08-09 DIAGNOSIS — R0609 Other forms of dyspnea: Secondary | ICD-10-CM

## 2023-08-09 DIAGNOSIS — M109 Gout, unspecified: Secondary | ICD-10-CM | POA: Diagnosis not present

## 2023-08-09 DIAGNOSIS — R609 Edema, unspecified: Secondary | ICD-10-CM | POA: Diagnosis not present

## 2023-08-09 DIAGNOSIS — M25522 Pain in left elbow: Secondary | ICD-10-CM | POA: Diagnosis not present

## 2023-08-14 DIAGNOSIS — E1165 Type 2 diabetes mellitus with hyperglycemia: Secondary | ICD-10-CM | POA: Diagnosis not present

## 2023-08-14 DIAGNOSIS — N182 Chronic kidney disease, stage 2 (mild): Secondary | ICD-10-CM | POA: Diagnosis not present

## 2023-08-14 DIAGNOSIS — R634 Abnormal weight loss: Secondary | ICD-10-CM | POA: Diagnosis not present

## 2023-08-14 DIAGNOSIS — E1122 Type 2 diabetes mellitus with diabetic chronic kidney disease: Secondary | ICD-10-CM | POA: Diagnosis not present

## 2023-08-14 DIAGNOSIS — R3589 Other polyuria: Secondary | ICD-10-CM | POA: Diagnosis not present

## 2023-08-15 ENCOUNTER — Other Ambulatory Visit (HOSPITAL_COMMUNITY): Payer: Self-pay

## 2023-08-15 MED ORDER — BASAGLAR KWIKPEN 100 UNIT/ML ~~LOC~~ SOPN
15.0000 [IU] | PEN_INJECTOR | Freq: Every day | SUBCUTANEOUS | 0 refills | Status: AC
Start: 2023-08-15 — End: ?
  Filled 2023-08-15: qty 15, 100d supply, fill #0

## 2023-08-16 DIAGNOSIS — R3589 Other polyuria: Secondary | ICD-10-CM | POA: Diagnosis not present

## 2023-08-16 DIAGNOSIS — R634 Abnormal weight loss: Secondary | ICD-10-CM | POA: Diagnosis not present

## 2023-08-16 DIAGNOSIS — E1122 Type 2 diabetes mellitus with diabetic chronic kidney disease: Secondary | ICD-10-CM | POA: Diagnosis not present

## 2023-08-16 DIAGNOSIS — E1165 Type 2 diabetes mellitus with hyperglycemia: Secondary | ICD-10-CM | POA: Diagnosis not present

## 2023-08-22 DIAGNOSIS — Z1212 Encounter for screening for malignant neoplasm of rectum: Secondary | ICD-10-CM | POA: Diagnosis not present

## 2023-08-22 DIAGNOSIS — E291 Testicular hypofunction: Secondary | ICD-10-CM | POA: Diagnosis not present

## 2023-08-22 DIAGNOSIS — E7800 Pure hypercholesterolemia, unspecified: Secondary | ICD-10-CM | POA: Diagnosis not present

## 2023-08-23 DIAGNOSIS — Z125 Encounter for screening for malignant neoplasm of prostate: Secondary | ICD-10-CM | POA: Diagnosis not present

## 2023-08-23 DIAGNOSIS — E7800 Pure hypercholesterolemia, unspecified: Secondary | ICD-10-CM | POA: Diagnosis not present

## 2023-08-23 DIAGNOSIS — E1122 Type 2 diabetes mellitus with diabetic chronic kidney disease: Secondary | ICD-10-CM | POA: Diagnosis not present

## 2023-08-23 DIAGNOSIS — E1165 Type 2 diabetes mellitus with hyperglycemia: Secondary | ICD-10-CM | POA: Diagnosis not present

## 2023-08-23 DIAGNOSIS — I129 Hypertensive chronic kidney disease with stage 1 through stage 4 chronic kidney disease, or unspecified chronic kidney disease: Secondary | ICD-10-CM | POA: Diagnosis not present

## 2023-08-23 DIAGNOSIS — N182 Chronic kidney disease, stage 2 (mild): Secondary | ICD-10-CM | POA: Diagnosis not present

## 2023-08-29 DIAGNOSIS — R809 Proteinuria, unspecified: Secondary | ICD-10-CM | POA: Diagnosis not present

## 2023-08-29 DIAGNOSIS — Z0000 Encounter for general adult medical examination without abnormal findings: Secondary | ICD-10-CM | POA: Diagnosis not present

## 2023-08-29 DIAGNOSIS — E1122 Type 2 diabetes mellitus with diabetic chronic kidney disease: Secondary | ICD-10-CM | POA: Diagnosis not present

## 2023-08-29 DIAGNOSIS — R82998 Other abnormal findings in urine: Secondary | ICD-10-CM | POA: Diagnosis not present

## 2023-08-29 DIAGNOSIS — E669 Obesity, unspecified: Secondary | ICD-10-CM | POA: Diagnosis not present

## 2023-08-29 DIAGNOSIS — G4700 Insomnia, unspecified: Secondary | ICD-10-CM | POA: Diagnosis not present

## 2023-08-29 DIAGNOSIS — I129 Hypertensive chronic kidney disease with stage 1 through stage 4 chronic kidney disease, or unspecified chronic kidney disease: Secondary | ICD-10-CM | POA: Diagnosis not present

## 2023-08-29 DIAGNOSIS — G4733 Obstructive sleep apnea (adult) (pediatric): Secondary | ICD-10-CM | POA: Diagnosis not present

## 2023-08-29 DIAGNOSIS — E7800 Pure hypercholesterolemia, unspecified: Secondary | ICD-10-CM | POA: Diagnosis not present

## 2023-08-29 DIAGNOSIS — F321 Major depressive disorder, single episode, moderate: Secondary | ICD-10-CM | POA: Diagnosis not present

## 2023-08-29 DIAGNOSIS — Z1339 Encounter for screening examination for other mental health and behavioral disorders: Secondary | ICD-10-CM | POA: Diagnosis not present

## 2023-08-29 DIAGNOSIS — E1165 Type 2 diabetes mellitus with hyperglycemia: Secondary | ICD-10-CM | POA: Diagnosis not present

## 2023-08-29 DIAGNOSIS — Z1331 Encounter for screening for depression: Secondary | ICD-10-CM | POA: Diagnosis not present

## 2023-08-29 DIAGNOSIS — N182 Chronic kidney disease, stage 2 (mild): Secondary | ICD-10-CM | POA: Diagnosis not present

## 2023-08-30 DIAGNOSIS — E1122 Type 2 diabetes mellitus with diabetic chronic kidney disease: Secondary | ICD-10-CM | POA: Diagnosis not present

## 2023-09-14 ENCOUNTER — Other Ambulatory Visit: Payer: Self-pay | Admitting: Cardiology

## 2023-09-14 DIAGNOSIS — R0609 Other forms of dyspnea: Secondary | ICD-10-CM

## 2023-10-03 DIAGNOSIS — E785 Hyperlipidemia, unspecified: Secondary | ICD-10-CM | POA: Diagnosis not present

## 2023-10-03 DIAGNOSIS — R748 Abnormal levels of other serum enzymes: Secondary | ICD-10-CM | POA: Diagnosis not present

## 2023-10-04 ENCOUNTER — Ambulatory Visit: Payer: Self-pay | Admitting: Cardiology

## 2023-10-04 LAB — LIPID PANEL
Chol/HDL Ratio: 2.7 ratio (ref 0.0–5.0)
Cholesterol, Total: 112 mg/dL (ref 100–199)
HDL: 41 mg/dL (ref >39)
LDL Chol Calc (NIH): 50 mg/dL (ref 0–99)
Triglycerides: 113 mg/dL (ref 0–149)
VLDL Cholesterol Cal: 21 mg/dL (ref 5–40)

## 2023-10-04 LAB — HEPATIC FUNCTION PANEL
ALT: 44 IU/L (ref 0–44)
AST: 31 IU/L (ref 0–40)
Albumin: 4.3 g/dL (ref 3.8–4.8)
Alkaline Phosphatase: 69 IU/L (ref 44–121)
Bilirubin Total: 0.6 mg/dL (ref 0.0–1.2)
Bilirubin, Direct: 0.26 mg/dL (ref 0.00–0.40)
Total Protein: 6.9 g/dL (ref 6.0–8.5)

## 2023-10-04 LAB — NUCLEOTIDASE, 5', BLOOD: 5-Nucleotidase: 3 IU/L (ref 0–13)

## 2023-10-04 LAB — GAMMA GT: GGT: 30 IU/L (ref 0–65)

## 2023-10-05 ENCOUNTER — Other Ambulatory Visit: Payer: Self-pay | Admitting: Cardiology

## 2023-10-05 DIAGNOSIS — R0609 Other forms of dyspnea: Secondary | ICD-10-CM

## 2023-10-23 NOTE — Progress Notes (Signed)
 HPI: FU coronary artery disease.  Seen with dyspnea April 2024.  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  Coronary CTA June 2024 showed mild stenoses in the LAD, circumflex and right coronary artery; calcium  score 650 which was 72nd percentile.  Since last seen patient denies dyspnea, chest pain, palpitations or syncope.  Current Outpatient Medications  Medication Sig Dispense Refill   aspirin EC 81 MG tablet Take 81 mg by mouth daily. Swallow whole.     atorvastatin  (LIPITOR) 20 MG tablet Take 1 tablet (20 mg total) by mouth daily. 90 tablet 3   benazepril (LOTENSIN) 40 MG tablet Take 40 mg by mouth daily.     ezetimibe  (ZETIA ) 10 MG tablet Take 1 tablet (10 mg total) by mouth daily. 90 tablet 3   spironolactone  (ALDACTONE ) 25 MG tablet Take 1 tablet (25 mg total) by mouth daily. 90 tablet 3   Insulin  Glargine (BASAGLAR  KWIKPEN) 100 UNIT/ML Inject 15 Units into the skin daily. (Patient not taking: Reported on 10/30/2023) 15 mL 0   metoprolol  tartrate (LOPRESSOR ) 25 MG tablet TAKE 2 HOURS PRIOR TO CT SCAN 1 tablet 0   naproxen sodium (ALEVE) 220 MG tablet Take 220 mg by mouth daily as needed. (Patient not taking: Reported on 10/30/2023)     No current facility-administered medications for this visit.     Past Medical History:  Diagnosis Date   Cervical spine disease    injections by Dr. Bonner   COVID-19    05/2019 minimal sx, no sequelae   ED (erectile dysfunction)    High cholesterol    Hypertension    Hypogonadism, male    Obesity    OSA (obstructive sleep apnea)    prior cpap, (refused to continue 2016)    Past Surgical History:  Procedure Laterality Date   KNEE SURGERY Left    ROTATOR CUFF REPAIR  2012    Social History   Socioeconomic History   Marital status: Married     Spouse name: Sherrilyn   Number of children: 2   Years of education: Assoc. Deg   Highest education level: Not on file  Occupational History    Comment: [employer]  Tobacco Use   Smoking status: Never   Smokeless tobacco: Never   Tobacco comments:    2 or 3 times monthly  Vaping Use   Vaping status: Never Used  Substance and Sexual Activity   Alcohol  use: Yes    Comment: 3-4 alcohol  drinks per week   Drug use: No   Sexual activity: Not on file  Other Topics Concern   Not on file  Social History Narrative   Patient lives at home with spouse.Caffeine Use: 3-5 times week.   Works (own business)   AD education   Children 2   Social Drivers of Corporate investment banker Strain: Not on file  Food Insecurity: Not on file  Transportation Needs: Not on file  Physical Activity: Not on file  Stress: Not on file  Social Connections: Not on file  Intimate Partner Violence: Not on file    Family History  Problem Relation Age of Onset   Diabetes Mother    Cerebral palsy Sister    Cancer Maternal Grandmother    Peripheral vascular disease Paternal Grandmother        s/p amputation   Sleep  apnea Neg Hx     ROS: no fevers or chills, productive cough, hemoptysis, dysphasia, odynophagia, melena, hematochezia, dysuria, hematuria, rash, seizure activity, orthopnea, PND, pedal edema, claudication. Remaining systems are negative.  Physical Exam: Well-developed well-nourished in no acute distress.  Skin is warm and dry.  HEENT is normal.  Neck is supple.  Chest is clear to auscultation with normal expansion.  Cardiovascular exam is regular rate and rhythm.  Abdominal exam nontender or distended. No masses palpated. Extremities show no edema. neuro grossly intact  EKG Interpretation Date/Time:  Tuesday October 30 2023 15:27:44 EDT Ventricular Rate:  63 PR Interval:  234 QRS Duration:  102 QT Interval:  428 QTC Calculation: 437 R Axis:   -34  Text Interpretation: Sinus rhythm with  1st degree A-V block Left axis deviation Incomplete right bundle branch block Inferior infarct , age undetermined Confirmed by Pietro Rogue ([id number]) on 10/30/2023 3:40:38 PM    A/P  1 coronary artery disease-mild on previous CTA.  Plan to continue medical therapy with aspirin and statin.  Patient denies chest pain.  2 hyperlipidemia-continue zetia  and statin.  3 history of dilated aortic root-we will plan follow-up CTA to reassess.  4 dyspnea on exertion-unchanged.  Note LV function is normal and CTA did not show obstructive coronary disease.  5 hypertension-patient's blood pressure is controlled.  Continue present medical regimen.  6 history of obstructive sleep apnea-managed by pulmonary.  Rogue Pietro, MD

## 2023-10-30 ENCOUNTER — Encounter: Payer: Self-pay | Admitting: Cardiology

## 2023-10-30 ENCOUNTER — Ambulatory Visit: Attending: Cardiology | Admitting: Cardiology

## 2023-10-30 VITALS — BP 116/72 | HR 63 | Ht 73.5 in | Wt 242.0 lb

## 2023-10-30 DIAGNOSIS — I7121 Aneurysm of the ascending aorta, without rupture: Secondary | ICD-10-CM | POA: Insufficient documentation

## 2023-10-30 DIAGNOSIS — E785 Hyperlipidemia, unspecified: Secondary | ICD-10-CM | POA: Insufficient documentation

## 2023-10-30 DIAGNOSIS — I10 Essential (primary) hypertension: Secondary | ICD-10-CM | POA: Insufficient documentation

## 2023-10-30 DIAGNOSIS — I2510 Atherosclerotic heart disease of native coronary artery without angina pectoris: Secondary | ICD-10-CM | POA: Diagnosis not present

## 2023-10-30 NOTE — Patient Instructions (Signed)
 Testing/Procedures: CT of the chest at [facility].   Follow-Up: At [facility], you and your health needs are our priority.  As part of our continuing mission to provide you with exceptional heart care, our providers are all part of one team.  This team includes your primary Cardiologist (physician) and Advanced Practice Providers or APPs (Physician Assistants and Nurse Practitioners) who all work together to provide you with the care you need, when you need it.  Your next appointment:   12 month(s)  Provider:   Campbell Shallow, MD.

## 2023-11-30 ENCOUNTER — Other Ambulatory Visit: Payer: Self-pay | Admitting: Cardiology

## 2023-11-30 DIAGNOSIS — E785 Hyperlipidemia, unspecified: Secondary | ICD-10-CM

## 2023-12-12 ENCOUNTER — Other Ambulatory Visit: Payer: Self-pay | Admitting: Cardiology

## 2023-12-12 DIAGNOSIS — E785 Hyperlipidemia, unspecified: Secondary | ICD-10-CM

## 2023-12-13 ENCOUNTER — Ambulatory Visit (HOSPITAL_COMMUNITY)
Admission: RE | Admit: 2023-12-13 | Discharge: 2023-12-13 | Disposition: A | Discharge status: Home / Self Care | Source: Ambulatory Visit | Attending: Cardiology | Admitting: Cardiology

## 2023-12-13 DIAGNOSIS — I7121 Aneurysm of the ascending aorta, without rupture: Secondary | ICD-10-CM | POA: Insufficient documentation

## 2023-12-13 DIAGNOSIS — I517 Cardiomegaly: Secondary | ICD-10-CM | POA: Diagnosis not present

## 2023-12-13 DIAGNOSIS — I2510 Atherosclerotic heart disease of native coronary artery without angina pectoris: Secondary | ICD-10-CM | POA: Insufficient documentation

## 2023-12-13 LAB — POCT I-STAT CREATININE: Creatinine, Ser: 0.8 mg/dL (ref 0.61–1.24)

## 2023-12-13 MED ORDER — IOHEXOL 350 MG/ML SOLN
75.0000 mL | Freq: Once | INTRAVENOUS | Status: AC | PRN
  Administered 2023-12-13: 75 mL via INTRAVENOUS

## 2023-12-18 ENCOUNTER — Ambulatory Visit: Payer: Self-pay | Admitting: Cardiology

## 2023-12-18 DIAGNOSIS — I7121 Aneurysm of the ascending aorta, without rupture: Secondary | ICD-10-CM

## 2023-12-23 NOTE — Progress Notes (Deleted)
 PATIENT: Raymond Marshall DOB: June 30, 1945  REASON FOR VISIT: follow up HISTORY FROM: patient PRIMARY NEUROLOGIST: Dr. Buck  No chief complaint on file.    HISTORY OF PRESENT ILLNESS: Today 12/23/23:  Raymond Marshall is a 78 y.o. male with a history of OSA on CPAP. Returns today for follow-up.      12/20/22: Raymond Marshall is a 78 y.o. male with a history of OSA on CPAP. Returns today for follow-up. Reports that CPAP is working well.  Feels refreshed when he wakes up in the morning.  His download is below      REVIEW OF SYSTEMS: Out of a complete 14 system review of symptoms, the patient complains only of the following symptoms, and all other reviewed systems are negative.  FSS 40 ESS 8  ALLERGIES: No Known Allergies  HOME MEDICATIONS: Outpatient Medications Prior to Visit  Medication Sig Dispense Refill   aspirin EC 81 MG tablet Take 81 mg by mouth daily. Swallow whole.     atorvastatin  (LIPITOR) 20 MG tablet Take 1 tablet (20 mg total) by mouth daily. 90 tablet 3   atorvastatin  (LIPITOR) 40 MG tablet TAKE ONE TABLET BY MOUTH DAILY 90 tablet 3   benazepril (LOTENSIN) 40 MG tablet Take 40 mg by mouth daily.     ezetimibe  (ZETIA ) 10 MG tablet Take 1 tablet (10 mg total) by mouth daily. 90 tablet 3   Insulin  Glargine (BASAGLAR  KWIKPEN) 100 UNIT/ML Inject 15 Units into the skin daily. (Patient not taking: Reported on 10/30/2023) 15 mL 0   metoprolol  tartrate (LOPRESSOR ) 25 MG tablet TAKE 2 HOURS PRIOR TO CT SCAN 1 tablet 0   naproxen sodium (ALEVE) 220 MG tablet Take 220 mg by mouth daily as needed. (Patient not taking: Reported on 10/30/2023)     spironolactone  (ALDACTONE ) 25 MG tablet Take 1 tablet (25 mg total) by mouth daily. 90 tablet 3   No facility-administered medications prior to visit.    PAST MEDICAL HISTORY: Past Medical History:  Diagnosis Date   Cervical spine disease    injections by Dr. Bonner   COVID-19    05/2019 minimal sx, no sequelae    ED (erectile dysfunction)    High cholesterol    Hypertension    Hypogonadism, male    Obesity    OSA (obstructive sleep apnea)    prior cpap, (refused to continue 2016)    PAST SURGICAL HISTORY: Past Surgical History:  Procedure Laterality Date   KNEE SURGERY Left    ROTATOR CUFF REPAIR  2012    FAMILY HISTORY: Family History  Problem Relation Age of Onset   Diabetes Mother    Cerebral palsy Sister    Cancer Maternal Grandmother    Peripheral vascular disease Paternal Grandmother        s/p amputation   Sleep apnea Neg Hx     SOCIAL HISTORY: Social History   Socioeconomic History   Marital status: Married    Spouse name: Sherrilyn   Number of children: 2   Years of education: Assoc. Deg   Highest education level: Not on file  Occupational History    Comment: [employer]  Tobacco Use   Smoking status: Never   Smokeless tobacco: Never   Tobacco comments:    2 or 3 times monthly  Vaping Use   Vaping status: Never Used  Substance and Sexual Activity   Alcohol  use: Yes    Comment: 3-4 alcohol  drinks per week   Drug use: No  Sexual activity: Not on file  Other Topics Concern   Not on file  Social History Narrative   Patient lives at home with spouse.Caffeine Use: 3-5 times week.   Works (own business)   AD education   Children 2   Social Drivers of Corporate investment banker Strain: Not on file  Food Insecurity: Not on file  Transportation Needs: Not on file  Physical Activity: Not on file  Stress: Not on file  Social Connections: Not on file  Intimate Partner Violence: Not on file      PHYSICAL EXAM  There were no vitals filed for this visit.  There is no height or weight on file to calculate BMI.  Generalized: Well developed, in no acute distress  Chest: Lungs clear to auscultation bilaterally  Neurological examination  Mentation: Alert oriented to time, place, history taking. Follows all commands speech and language fluent Cranial nerve  II-XII: Facial symmetry noted   DIAGNOSTIC DATA (LABS, IMAGING, TESTING) - I reviewed patient records, labs, notes, testing and imaging myself where available.  No results found for: WBC, HGB, HCT, MCV, PLT    Component Value Date/Time   NA 139 08/31/2022 0852   K 4.6 08/31/2022 0852   CL 104 08/31/2022 0852   CO2 23 08/31/2022 0852   GLUCOSE 138 (H) 08/31/2022 0852   BUN 13 08/31/2022 0852   CREATININE 0.80 12/13/2023 0948   CALCIUM  9.4 08/31/2022 0852   PROT 6.9 10/03/2023 0815   ALBUMIN 4.3 10/03/2023 0815   AST 31 10/03/2023 0815   ALT 44 10/03/2023 0815   ALKPHOS 69 10/03/2023 0815   BILITOT 0.6 10/03/2023 0815   Lab Results  Component Value Date   CHOL 112 10/03/2023   HDL 41 10/03/2023   LDLCALC 50 10/03/2023   TRIG 113 10/03/2023   CHOLHDL 2.7 10/03/2023     ASSESSMENT AND PLAN 79 y.o. year old male  has a past medical history of Cervical spine disease, COVID-19, ED (erectile dysfunction), High cholesterol, Hypertension, Hypogonadism, male, Obesity, and OSA (obstructive sleep apnea). here with:  OSA on CPAP  - CPAP compliance excellent - Good treatment of AHI  - Encourage patient to use CPAP nightly and > 4 hours each night - F/U in 1 year or sooner if needed    Duwaine Russell, MSN, NP-C 12/23/2023, 3:20 PM [facility] [address], [address] [place], [place] [address] [phone]

## 2023-12-24 ENCOUNTER — Ambulatory Visit: Payer: Medicare Other | Admitting: Adult Health

## 2023-12-24 ENCOUNTER — Encounter: Payer: Self-pay | Admitting: Adult Health

## 2024-01-30 DIAGNOSIS — G4733 Obstructive sleep apnea (adult) (pediatric): Secondary | ICD-10-CM | POA: Diagnosis not present

## 2024-01-30 DIAGNOSIS — G4700 Insomnia, unspecified: Secondary | ICD-10-CM | POA: Diagnosis not present

## 2024-01-30 DIAGNOSIS — Z23 Encounter for immunization: Secondary | ICD-10-CM | POA: Diagnosis not present

## 2024-01-30 DIAGNOSIS — R809 Proteinuria, unspecified: Secondary | ICD-10-CM | POA: Diagnosis not present

## 2024-01-30 DIAGNOSIS — E7800 Pure hypercholesterolemia, unspecified: Secondary | ICD-10-CM | POA: Diagnosis not present

## 2024-01-30 DIAGNOSIS — F321 Major depressive disorder, single episode, moderate: Secondary | ICD-10-CM | POA: Diagnosis not present

## 2024-01-30 DIAGNOSIS — I129 Hypertensive chronic kidney disease with stage 1 through stage 4 chronic kidney disease, or unspecified chronic kidney disease: Secondary | ICD-10-CM | POA: Diagnosis not present

## 2024-01-30 DIAGNOSIS — E669 Obesity, unspecified: Secondary | ICD-10-CM | POA: Diagnosis not present

## 2024-01-30 DIAGNOSIS — E1122 Type 2 diabetes mellitus with diabetic chronic kidney disease: Secondary | ICD-10-CM | POA: Diagnosis not present

## 2024-01-30 DIAGNOSIS — N182 Chronic kidney disease, stage 2 (mild): Secondary | ICD-10-CM | POA: Diagnosis not present

## 2024-01-30 DIAGNOSIS — I444 Left anterior fascicular block: Secondary | ICD-10-CM | POA: Diagnosis not present

## 2024-01-30 DIAGNOSIS — M1710 Unilateral primary osteoarthritis, unspecified knee: Secondary | ICD-10-CM | POA: Diagnosis not present

## 2024-03-12 ENCOUNTER — Ambulatory Visit: Admitting: Orthopedic Surgery

## 2024-04-04 ENCOUNTER — Ambulatory Visit: Admitting: Orthopedic Surgery

## 2024-04-04 ENCOUNTER — Other Ambulatory Visit: Payer: Self-pay

## 2024-04-04 ENCOUNTER — Encounter: Payer: Self-pay | Admitting: Orthopedic Surgery

## 2024-04-04 DIAGNOSIS — M25562 Pain in left knee: Secondary | ICD-10-CM

## 2024-04-04 NOTE — Progress Notes (Signed)
 Office Visit Note   Patient: Raymond Marshall           Date of Birth: 25-Nov-1945           MRN: [id number] Visit Date: 04/04/2024 Requested by: Vernadine Charlie ORN, MD [address] [place],  [place] [address] PCP: Vernadine Charlie ORN, MD  Subjective: Chief Complaint  Patient presents with   Left Knee - Pain    HPI: Raymond Marshall is a 78 y.o. male who presents to the office reporting left knee pain.  The pain has been chronic for years.  Did have knee arthroscopy about 38 years ago.  He reports some swelling but the pain does not wake him from sleep.  Does not report much in the way of mechanical symptoms.  Does describe decreased flexion range of motion.  He is very active.  Planning to start gym membership with his wife in the near future.  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  Does take occasional Advil but not on a regular basis..                ROS: All systems reviewed are negative as they relate to the chief complaint within the history of present illness.  Patient denies fevers or chills.  Assessment & Plan: Visit Diagnoses:  1. 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  Overall Raymond Marshall is much less clinically symptomatic than the moderate to severe arthritis he has in the knee.  We talked about operative and nonoperative treatment options for this problem.  As an initial treatment plan nonload bearing quad strengthening exercises consisting of stationary bike leg extension and leg press with light weight and more frequent reps would be a good way to strengthen the quad and hamstring to decrease some of the load that the joint surface has to bear.  Weight loss will also help some of the baseline amount of pain he is having.  We could also consider if that initial approach fails doing episodic alternating cortisone and gel injections every 3 months.   He will come back as his symptoms progress.  Follow-Up Instructions: No follow-ups on file.   Orders:  Orders Placed This Encounter  Procedures   XR KNEE 3 VIEW LEFT   No orders of the defined types were placed in this encounter.     Procedures: No procedures performed   Clinical Data: No additional findings.  Objective: Vital Signs: There were no vitals taken for this visit.  Physical Exam:  Constitutional: Patient appears well-developed HEENT:  Head: Normocephalic Eyes:EOM are normal Neck: Normal range of motion Cardiovascular: Normal rate Pulmonary/chest: Effort normal Neurologic: Patient is alert Skin: Skin is warm Psychiatric: Patient has normal mood and affect  Ortho Exam: Ortho exam demonstrates range of motion on the left of about 5-1 30 of flexion on the right 0-1 35.  Trace effusion left knee no effusion right knee.  Collateral cruciate ligaments are stable on the left.  Slightly more pitting edema in the left leg compared to the right leg but no calf tenderness and negative Homans.  No groin pain with internal/external rotation of that left leg.  Gait is normal.  Quad strength excellent bilaterally.  Specialty Comments:  No specialty comments available.  Imaging: XR KNEE 3 VIEW LEFT Result Date: 04/04/2024 AP lateral merchant radiographs left knee reviewed.  Slight valgus alignment  noted.  Severe tricompartmental arthritis is present worse in the lateral compartment.  No acute fracture.    PMFS History: Patient Active Problem List   Diagnosis Date Noted   HYPERLIPIDEMIA 09/03/2009   ABNORMAL ELECTROCARDIOGRAM 09/03/2009   OVERWEIGHT 09/02/2009   HYPERTENSION 09/02/2009   Past Medical History:  Diagnosis Date   Cervical spine disease    injections by Dr. Bonner   COVID-19    05/2019 minimal sx, no sequelae   ED (erectile dysfunction)    High cholesterol    Hypertension    Hypogonadism, male    Obesity    OSA (obstructive sleep apnea)    prior  cpap, (refused to continue 2016)    Family History  Problem Relation Age of Onset   Diabetes Mother    Cerebral palsy Sister    Cancer Maternal Grandmother    Peripheral vascular disease Paternal Grandmother        s/p amputation   Sleep apnea Neg Hx     Past Surgical History:  Procedure Laterality Date   KNEE SURGERY Left    ROTATOR CUFF REPAIR  2012   Social History   Occupational History    Comment: [employer]  Tobacco Use   Smoking status: Never   Smokeless tobacco: Never   Tobacco comments:    2 or 3 times monthly  Vaping Use   Vaping status: Never Used  Substance and Sexual Activity   Alcohol  use: Yes    Comment: 3-4 alcohol  drinks per week   Drug use: No   Sexual activity: Not on file
